# Patient Record
Sex: Female | Born: 1940 | Race: White | Hispanic: No | Marital: Married | Smoking: Former smoker
Health system: Southern US, Community
[De-identification: ages and names within clinical notes are randomized; demographics above are authoritative.]

## PROBLEM LIST (undated history)

## (undated) DIAGNOSIS — I6529 Occlusion and stenosis of unspecified carotid artery: Secondary | ICD-10-CM

## (undated) DIAGNOSIS — L309 Dermatitis, unspecified: Secondary | ICD-10-CM

## (undated) DIAGNOSIS — I10 Essential (primary) hypertension: Secondary | ICD-10-CM

## (undated) DIAGNOSIS — K529 Noninfective gastroenteritis and colitis, unspecified: Secondary | ICD-10-CM

## (undated) DIAGNOSIS — E785 Hyperlipidemia, unspecified: Secondary | ICD-10-CM

## (undated) DIAGNOSIS — K5090 Crohn's disease, unspecified, without complications: Secondary | ICD-10-CM

## (undated) DIAGNOSIS — K449 Diaphragmatic hernia without obstruction or gangrene: Secondary | ICD-10-CM

## (undated) DIAGNOSIS — R233 Spontaneous ecchymoses: Secondary | ICD-10-CM

## (undated) DIAGNOSIS — R42 Dizziness and giddiness: Secondary | ICD-10-CM

## (undated) DIAGNOSIS — I7300 Raynaud's syndrome without gangrene: Secondary | ICD-10-CM

## (undated) DIAGNOSIS — I059 Rheumatic mitral valve disease, unspecified: Secondary | ICD-10-CM

## (undated) DIAGNOSIS — S4291XA Fracture of right shoulder girdle, part unspecified, initial encounter for closed fracture: Secondary | ICD-10-CM

## (undated) DIAGNOSIS — E871 Hypo-osmolality and hyponatremia: Secondary | ICD-10-CM

## (undated) DIAGNOSIS — I4891 Unspecified atrial fibrillation: Secondary | ICD-10-CM

## (undated) DIAGNOSIS — K5792 Diverticulitis of intestine, part unspecified, without perforation or abscess without bleeding: Secondary | ICD-10-CM

## (undated) DIAGNOSIS — M329 Systemic lupus erythematosus, unspecified: Secondary | ICD-10-CM

## (undated) DIAGNOSIS — K219 Gastro-esophageal reflux disease without esophagitis: Secondary | ICD-10-CM

## (undated) DIAGNOSIS — D649 Anemia, unspecified: Secondary | ICD-10-CM

## (undated) DIAGNOSIS — M797 Fibromyalgia: Secondary | ICD-10-CM

## (undated) DIAGNOSIS — E889 Metabolic disorder, unspecified: Secondary | ICD-10-CM

## (undated) DIAGNOSIS — I272 Pulmonary hypertension, unspecified: Secondary | ICD-10-CM

## (undated) DIAGNOSIS — M8580 Other specified disorders of bone density and structure, unspecified site: Secondary | ICD-10-CM

## (undated) HISTORY — DX: Raynaud's syndrome without gangrene: I73.00

## (undated) HISTORY — DX: Rheumatic mitral valve disease, unspecified: I05.9

## (undated) HISTORY — DX: Essential (primary) hypertension: I10

## (undated) HISTORY — DX: Occlusion and stenosis of unspecified carotid artery: I65.29

## (undated) HISTORY — PX: ABDOMINAL HYSTERECTOMY: SHX81

## (undated) HISTORY — DX: Dizziness and giddiness: R42

## (undated) HISTORY — DX: Systemic lupus erythematosus, unspecified: M32.9

## (undated) HISTORY — DX: Fracture of right shoulder girdle, part unspecified, initial encounter for closed fracture: S42.91XA

## (undated) HISTORY — PX: CHOLECYSTECTOMY: SHX55

## (undated) HISTORY — DX: Hyperlipidemia, unspecified: E78.5

## (undated) HISTORY — DX: Spontaneous ecchymoses: R23.3

## (undated) HISTORY — DX: Anemia, unspecified: D64.9

## (undated) HISTORY — DX: Other specified disorders of bone density and structure, unspecified site: M85.80

## (undated) HISTORY — DX: Gastro-esophageal reflux disease without esophagitis: K21.9

## (undated) HISTORY — DX: Hypo-osmolality and hyponatremia: E87.1

## (undated) HISTORY — PX: EYE SURGERY: SHX253

## (undated) HISTORY — PX: APPENDECTOMY: SHX54

## (undated) HISTORY — DX: Diverticulitis of intestine, part unspecified, without perforation or abscess without bleeding: K57.92

## (undated) HISTORY — PX: PARTIAL COLECTOMY: SHX5273

## (undated) HISTORY — DX: Fibromyalgia: M79.7

## (undated) HISTORY — DX: Unspecified atrial fibrillation: I48.91

## (undated) HISTORY — DX: Dermatitis, unspecified: L30.9

## (undated) HISTORY — DX: Noninfective gastroenteritis and colitis, unspecified: K52.9

## (undated) HISTORY — DX: Diaphragmatic hernia without obstruction or gangrene: K44.9

## (undated) HISTORY — DX: Metabolic disorder, unspecified: E88.9

## (undated) HISTORY — DX: Pulmonary hypertension, unspecified: I27.20

---

## 2012-04-25 ENCOUNTER — Encounter: Payer: Self-pay | Admitting: Vascular Surgery

## 2012-04-28 ENCOUNTER — Encounter: Payer: Self-pay | Admitting: Vascular Surgery

## 2012-04-29 ENCOUNTER — Ambulatory Visit (INDEPENDENT_AMBULATORY_CARE_PROVIDER_SITE_OTHER): Payer: Medicare Other | Admitting: Vascular Surgery

## 2012-04-29 ENCOUNTER — Encounter: Payer: Self-pay | Admitting: Vascular Surgery

## 2012-04-29 ENCOUNTER — Other Ambulatory Visit: Payer: Self-pay

## 2012-04-29 VITALS — BP 146/82 | HR 60 | Resp 18 | Ht 64.0 in | Wt 118.0 lb

## 2012-04-29 DIAGNOSIS — I6529 Occlusion and stenosis of unspecified carotid artery: Secondary | ICD-10-CM

## 2012-04-29 NOTE — Progress Notes (Signed)
[facility]ist [facility]   Patient name: Alyssa Caldwell MRN: [id number] DOB: Aug 31, 1940 Sex: female   Referred by: Elenor Legato  Reason for referral: No chief complaint on file.   HISTORY OF PRESENT ILLNESS: The patient is seen today for discussion regarding carotid duplex revealing moderate to severe left carotid stenosis. I had operated on her father with a carotid endarterectomy which was uneventful at the age of 17. This isEllen Caldwell's mother. She denies any episodes of 8 aphasia or right sided weakness. She does have multiple ocular diagnoses with macular degeneration and others. She does report some occasional vision loss in her right eye and has seen multiple I. specialist regarding this. There has been no suggestion of Hollenhorst plaque.  Past Medical History  Diagnosis Date  . Anemia   . Dermatitis   . Vertigo   . Spontaneous ecchymoses   . Carotid artery occlusion   . Hyperlipidemia   . Diverticulitis   . Fibromyalgia   . Enteritis   . Pulmonary hypertension   . Osteopenia   . Raynaud's disease   . GERD (gastroesophageal reflux disease)   . Hypertension   . Mitral valve disorders   . Enzymopathy   . Systemic lupus erythematosus     Past Surgical History  Procedure Date  . Abdominal hysterectomy   . Appendectomy   . Cholecystectomy   . Partial colectomy     History   Social History  . Marital Status: Married    Spouse Name: N/A    Number of Children: N/A  . Years of Education: N/A   Occupational History  . Not on file.   Social History Main Topics  . Smoking status: Former Smoker    Types: Cigarettes    Quit date: 04/25/2002  . Smokeless tobacco: Not on file  . Alcohol Use: Yes  . Drug Use: No  . Sexually Active: Not on file   Other Topics Concern  . Not on file   Social History Narrative  . No narrative on file    Family History  Problem Relation Age of Onset  . Stroke Father   . Heart disease Father     Allergies as  of 04/29/2012 - Review Complete 04/29/2012  Allergen Reaction Noted  . Codeine  04/25/2012  . Lyrica (pregabalin)  04/25/2012  . Tape  04/29/2012    Current Outpatient Prescriptions on File Prior to Visit  Medication Sig Dispense Refill  . calcium carbonate (OS-CAL) 600 MG TABS Take 600 mg by mouth 2 (two) times daily with a meal.      . Cholecalciferol (VITAMIN D3) 5000 UNITS CAPS Take 5,000 Units by mouth daily.      . cyclobenzaprine (FLEXERIL) 10 MG tablet Take 10 mg by mouth 3 (three) times daily as needed.      . fluticasone (FLOVENT DISKUS) 50 MCG/BLIST diskus inhaler Inhale 1 puff into the lungs 2 (two) times daily.      . hydroxychloroquine (PLAQUENIL) 200 MG tablet Take 200 mg by mouth 2 (two) times daily.      Marland Kitchen ibandronate (BONIVA) 150 MG tablet Take 150 mg by mouth every 30 (thirty) days. Take in the morning with a full glass of water, on an empty stomach, and do not take anything else by mouth or lie down for the next 30 min.      . inFLIXimab (REMICADE) 100 MG injection Inject 100 mg into the vein. Every six weeks 100mg  IV      .  metoprolol succinate (TOPROL-XL) 25 MG 24 hr tablet Take 25 mg by mouth daily.      Marland Kitchen omeprazole (PRILOSEC) 40 MG capsule Take 40 mg by mouth daily.      . Probiotic Product (ALIGN PO) Take by mouth daily.      Marland Kitchen torsemide (DEMADEX) 10 MG tablet Take 10 mg by mouth daily.      . zaleplon (SONATA) 5 MG capsule Take 5 mg by mouth at bedtime.         REVIEW OF SYSTEMS:  Positives indicated with an "X"  CARDIOVASCULAR:  [ ]  chest pain   [x ] chest pressure   [ ]  palpitations   [ ]  orthopnea   [x ] dyspnea on exertion   [ ]  claudication   [ ]  rest pain   [ ]  DVT   [ ]  phlebitis PULMONARY:   [ ]  productive cough   [ ]  asthma   [ ]  wheezing NEUROLOGIC:   [ x] weakness  [x ] paresthesias  [ ]  aphasia  [ ]  amaurosis  [x ] dizziness HEMATOLOGIC:   [x ] bleeding problems   [ ]  clotting disorders MUSCULOSKELETAL:  [ ]  joint pain   [ ]  joint  swelling GASTROINTESTINAL: [ ]   blood in stool  [ ]   hematemesis GENITOURINARY:  [ ]   dysuria  [ ]   hematuria PSYCHIATRIC:  [ ]  history of major depression INTEGUMENTARY:  [x ] rashes  [x ] ulcers CONSTITUTIONAL:  [x ] fever   [x ] chills  PHYSICAL EXAMINATION:  General: The patient is a well-nourished female, in no acute distress. Vital signs are BP 146/82  Pulse 60  Resp 18  Ht 5\' 4"  (1.626 m)  Wt 118 lb (53.524 kg)  BMI 20.25 kg/m2 Pulmonary: There is a good air exchange bilaterally without wheezing or rales. Abdomen: Soft and non-tender with normal pitch bowel sounds. Musculoskeletal: There are no major deformities.  There is no significant extremity pain. Neurologic: No focal weakness or paresthesias are detected, Skin: There are no ulcer or rashes noted. Psychiatric: The patient has normal affect. Cardiovascular: There is a regular rate and rhythm without significant murmur appreciated. Carotid arteries without bruits bilaterally Pulse status 2+ radial 2+ femoral 2+ popliteal and 1-2+ posterior tibial pulses bilaterally   Vascular Lab Studies:  Carotid duplex from [facility] reviewed. She is in the lower scale of the 60-79% stenosis on the left. No significant stenosis on the right  Impression and Plan:  Asymptomatic left moderate to severe internal carotid artery stenosis. A long discussion with the patient and her husband regarding treatment of this. I would recommend that we followup her duplex 6 months from her last study which was in July. I discussed the symptoms of a left brain event and left eye visual changes. He'll notify us should this occur otherwise we will see her in 4 months with 6-2+ duplex followup of her moderate to severe left carotid stenosis    Alyssa Caldwell [facility] [place] Office: [phone]

## 2012-04-29 NOTE — Addendum Note (Signed)
Addended by: Sharee Pimple on: 04/29/2012 01:11 PM   Modules accepted: Orders

## 2012-05-16 ENCOUNTER — Inpatient Hospital Stay (HOSPITAL_COMMUNITY)
Admission: EM | Admit: 2012-05-16 | Discharge: 2012-05-20 | DRG: 312 | Disposition: A | Discharge status: Home / Self Care | Payer: Medicare Other | Attending: Internal Medicine | Admitting: Internal Medicine

## 2012-05-16 ENCOUNTER — Emergency Department (HOSPITAL_COMMUNITY): Payer: Medicare Other

## 2012-05-16 ENCOUNTER — Encounter (HOSPITAL_COMMUNITY): Payer: Self-pay | Admitting: Emergency Medicine

## 2012-05-16 DIAGNOSIS — E860 Dehydration: Secondary | ICD-10-CM

## 2012-05-16 DIAGNOSIS — E876 Hypokalemia: Secondary | ICD-10-CM

## 2012-05-16 DIAGNOSIS — R55 Syncope and collapse: Secondary | ICD-10-CM

## 2012-05-16 DIAGNOSIS — E871 Hypo-osmolality and hyponatremia: Secondary | ICD-10-CM

## 2012-05-16 DIAGNOSIS — D649 Anemia, unspecified: Secondary | ICD-10-CM | POA: Diagnosis present

## 2012-05-16 DIAGNOSIS — M329 Systemic lupus erythematosus, unspecified: Secondary | ICD-10-CM | POA: Diagnosis present

## 2012-05-16 DIAGNOSIS — Z9089 Acquired absence of other organs: Secondary | ICD-10-CM

## 2012-05-16 DIAGNOSIS — K219 Gastro-esophageal reflux disease without esophagitis: Secondary | ICD-10-CM | POA: Diagnosis present

## 2012-05-16 DIAGNOSIS — Z8719 Personal history of other diseases of the digestive system: Secondary | ICD-10-CM

## 2012-05-16 DIAGNOSIS — R110 Nausea: Secondary | ICD-10-CM | POA: Diagnosis present

## 2012-05-16 DIAGNOSIS — K5090 Crohn's disease, unspecified, without complications: Secondary | ICD-10-CM | POA: Diagnosis present

## 2012-05-16 DIAGNOSIS — E785 Hyperlipidemia, unspecified: Secondary | ICD-10-CM | POA: Diagnosis present

## 2012-05-16 DIAGNOSIS — I2789 Other specified pulmonary heart diseases: Secondary | ICD-10-CM | POA: Diagnosis present

## 2012-05-16 DIAGNOSIS — I951 Orthostatic hypotension: Principal | ICD-10-CM | POA: Diagnosis present

## 2012-05-16 DIAGNOSIS — Z885 Allergy status to narcotic agent status: Secondary | ICD-10-CM

## 2012-05-16 DIAGNOSIS — Z87891 Personal history of nicotine dependence: Secondary | ICD-10-CM

## 2012-05-16 DIAGNOSIS — IMO0002 Reserved for concepts with insufficient information to code with codable children: Secondary | ICD-10-CM

## 2012-05-16 DIAGNOSIS — R112 Nausea with vomiting, unspecified: Secondary | ICD-10-CM | POA: Diagnosis present

## 2012-05-16 DIAGNOSIS — Z90710 Acquired absence of both cervix and uterus: Secondary | ICD-10-CM

## 2012-05-16 DIAGNOSIS — Z888 Allergy status to other drugs, medicaments and biological substances status: Secondary | ICD-10-CM

## 2012-05-16 DIAGNOSIS — I6529 Occlusion and stenosis of unspecified carotid artery: Secondary | ICD-10-CM | POA: Diagnosis present

## 2012-05-16 DIAGNOSIS — E43 Unspecified severe protein-calorie malnutrition: Secondary | ICD-10-CM | POA: Diagnosis present

## 2012-05-16 DIAGNOSIS — I10 Essential (primary) hypertension: Secondary | ICD-10-CM | POA: Diagnosis present

## 2012-05-16 DIAGNOSIS — I059 Rheumatic mitral valve disease, unspecified: Secondary | ICD-10-CM | POA: Diagnosis present

## 2012-05-16 DIAGNOSIS — IMO0001 Reserved for inherently not codable concepts without codable children: Secondary | ICD-10-CM | POA: Diagnosis present

## 2012-05-16 DIAGNOSIS — Z681 Body mass index (BMI) 19 or less, adult: Secondary | ICD-10-CM

## 2012-05-16 DIAGNOSIS — M899 Disorder of bone, unspecified: Secondary | ICD-10-CM | POA: Diagnosis present

## 2012-05-16 DIAGNOSIS — I7300 Raynaud's syndrome without gangrene: Secondary | ICD-10-CM | POA: Diagnosis present

## 2012-05-16 DIAGNOSIS — Z79899 Other long term (current) drug therapy: Secondary | ICD-10-CM

## 2012-05-16 HISTORY — DX: Crohn's disease, unspecified, without complications: K50.90

## 2012-05-16 LAB — CBC WITH DIFFERENTIAL/PLATELET
Basophils Absolute: 0 10*3/uL (ref 0.0–0.1)
Eosinophils Relative: 0 % (ref 0–5)
Lymphocytes Relative: 11 % — ABNORMAL LOW (ref 12–46)
Lymphs Abs: 1 10*3/uL (ref 0.7–4.0)
MCV: 84.9 fL (ref 78.0–100.0)
Neutro Abs: 8.1 10*3/uL — ABNORMAL HIGH (ref 1.7–7.7)
Neutrophils Relative %: 85 % — ABNORMAL HIGH (ref 43–77)
Platelets: 266 10*3/uL (ref 150–400)
RBC: 3.85 MIL/uL — ABNORMAL LOW (ref 3.87–5.11)
RDW: 12.4 % (ref 11.5–15.5)
WBC: 9.6 10*3/uL (ref 4.0–10.5)

## 2012-05-16 LAB — URINALYSIS, ROUTINE W REFLEX MICROSCOPIC
Ketones, ur: NEGATIVE mg/dL
Protein, ur: NEGATIVE mg/dL
Urobilinogen, UA: 1 mg/dL (ref 0.0–1.0)

## 2012-05-16 LAB — URINE MICROSCOPIC-ADD ON

## 2012-05-16 LAB — BASIC METABOLIC PANEL
Calcium: 8.5 mg/dL (ref 8.4–10.5)
Creatinine, Ser: 0.77 mg/dL (ref 0.50–1.10)
GFR calc non Af Amer: 82 mL/min — ABNORMAL LOW (ref >90)
Sodium: 118 mEq/L — CL (ref 135–145)

## 2012-05-16 LAB — TROPONIN I: Troponin I: <0.3 ng/mL (ref <0.30)

## 2012-05-16 MED ORDER — PREDNISONE 10 MG PO TABS
10.0000 mg | ORAL_TABLET | Freq: Four times a day (QID) | ORAL | Status: DC
  Administered 2012-05-16 – 2012-05-20 (×16): 10 mg via ORAL
  Filled 2012-05-16 (×18): qty 1

## 2012-05-16 MED ORDER — SODIUM CHLORIDE 0.9 % IJ SOLN
3.0000 mL | Freq: Two times a day (BID) | INTRAMUSCULAR | Status: DC
  Administered 2012-05-18 – 2012-05-19 (×3): 3 mL via INTRAVENOUS

## 2012-05-16 MED ORDER — METOPROLOL SUCCINATE ER 25 MG PO TB24
25.0000 mg | ORAL_TABLET | Freq: Every day | ORAL | Status: DC
  Administered 2012-05-16 – 2012-05-20 (×5): 25 mg via ORAL
  Filled 2012-05-16 (×5): qty 1

## 2012-05-16 MED ORDER — FLUTICASONE PROPIONATE 50 MCG/ACT NA SUSP
2.0000 | Freq: Every day | NASAL | Status: DC | PRN
  Filled 2012-05-16: qty 16

## 2012-05-16 MED ORDER — SODIUM CHLORIDE 0.9 % IV BOLUS (SEPSIS)
1000.0000 mL | Freq: Once | INTRAVENOUS | Status: AC
  Administered 2012-05-16: 1000 mL via INTRAVENOUS

## 2012-05-16 MED ORDER — ONDANSETRON HCL 4 MG PO TABS
4.0000 mg | ORAL_TABLET | Freq: Four times a day (QID) | ORAL | Status: DC | PRN
  Filled 2012-05-16: qty 1

## 2012-05-16 MED ORDER — ALBUTEROL SULFATE (5 MG/ML) 0.5% IN NEBU: 2.5000 mg | INHALATION_SOLUTION | RESPIRATORY_TRACT | Status: DC | PRN

## 2012-05-16 MED ORDER — CYCLOBENZAPRINE HCL 10 MG PO TABS: 10.0000 mg | ORAL_TABLET | Freq: Three times a day (TID) | ORAL | Status: DC | PRN

## 2012-05-16 MED ORDER — SODIUM CHLORIDE 0.9 % IV SOLN
Freq: Once | INTRAVENOUS | Status: AC
  Administered 2012-05-16: 14:00:00 via INTRAVENOUS

## 2012-05-16 MED ORDER — ALIGN PO CAPS
1.0000 | ORAL_CAPSULE | Freq: Every day | ORAL | Status: DC
  Filled 2012-05-16: qty 1

## 2012-05-16 MED ORDER — ENOXAPARIN SODIUM 40 MG/0.4ML ~~LOC~~ SOLN
40.0000 mg | SUBCUTANEOUS | Status: DC
  Administered 2012-05-16 – 2012-05-19 (×4): 40 mg via SUBCUTANEOUS
  Filled 2012-05-16 (×5): qty 0.4

## 2012-05-16 MED ORDER — PANTOPRAZOLE SODIUM 40 MG PO TBEC
80.0000 mg | DELAYED_RELEASE_TABLET | Freq: Every day | ORAL | Status: DC
  Administered 2012-05-16 – 2012-05-20 (×5): 80 mg via ORAL
  Filled 2012-05-16 (×4): qty 2
  Filled 2012-05-16: qty 1

## 2012-05-16 MED ORDER — ATORVASTATIN CALCIUM 20 MG PO TABS
20.0000 mg | ORAL_TABLET | Freq: Every day | ORAL | Status: DC
  Administered 2012-05-19 – 2012-05-20 (×2): 20 mg via ORAL
  Filled 2012-05-16 (×5): qty 1

## 2012-05-16 MED ORDER — ONDANSETRON HCL 4 MG/2ML IJ SOLN
4.0000 mg | Freq: Four times a day (QID) | INTRAMUSCULAR | Status: DC | PRN
  Administered 2012-05-16 – 2012-05-20 (×11): 4 mg via INTRAVENOUS
  Filled 2012-05-16 (×11): qty 2

## 2012-05-16 MED ORDER — ACETAMINOPHEN 650 MG RE SUPP: 650.0000 mg | Freq: Four times a day (QID) | RECTAL | Status: DC | PRN

## 2012-05-16 MED ORDER — ZOLPIDEM TARTRATE 5 MG PO TABS
5.0000 mg | ORAL_TABLET | Freq: Every evening | ORAL | Status: DC | PRN
  Administered 2012-05-16 – 2012-05-19 (×4): 5 mg via ORAL
  Filled 2012-05-16 (×4): qty 1

## 2012-05-16 MED ORDER — POTASSIUM CHLORIDE CRYS ER 20 MEQ PO TBCR
60.0000 meq | EXTENDED_RELEASE_TABLET | Freq: Once | ORAL | Status: AC
  Administered 2012-05-16: 60 meq via ORAL
  Filled 2012-05-16: qty 3

## 2012-05-16 MED ORDER — ACETAMINOPHEN 325 MG PO TABS
650.0000 mg | ORAL_TABLET | Freq: Four times a day (QID) | ORAL | Status: DC | PRN
  Administered 2012-05-16: 650 mg via ORAL
  Filled 2012-05-16: qty 2

## 2012-05-16 MED ORDER — RISAQUAD PO CAPS
1.0000 | ORAL_CAPSULE | Freq: Every day | ORAL | Status: DC
  Administered 2012-05-19 – 2012-05-20 (×2): 1 via ORAL
  Filled 2012-05-16 (×5): qty 1

## 2012-05-16 MED ORDER — POTASSIUM CHLORIDE IN NACL 20-0.9 MEQ/L-% IV SOLN
INTRAVENOUS | Status: DC
  Administered 2012-05-16: 21:00:00 via INTRAVENOUS
  Filled 2012-05-16 (×4): qty 1000

## 2012-05-16 NOTE — ED Notes (Signed)
Patient states she is still unable to void at this time

## 2012-05-16 NOTE — ED Notes (Signed)
Patient is unable to void at this time 

## 2012-05-16 NOTE — ED Notes (Signed)
Lab called-last draws were clotted.

## 2012-05-16 NOTE — H&P (Signed)
[facility] History and Physical  Alyssa Caldwell WUJ:[id number] DOB: 02/25/1941 DOA: 05/16/2012  Referring physician: Derwood Kaplan PCP: No primary provider on file.  Specialists:  Chief Complaint: Passed out  HPI: Alyssa Caldwell is a 71 y.o. female with history of lupus, Crohn's disease, hypertension, hyperlipidemia, chronic hyponatremia was brought to the emergency department secondary to passing out. She has been unwell for approximately a week. She gives 6 days history of nausea which is worse when she is ambulating or sitting up, poor oral intake, intermittent nausea and vomiting and diarrhea. No coffee grounds, blood or melena. Denies abdominal pain. Complains of feeling hot and cold. Feels dizzy and lightheaded at times on sitting or ambulating. Today she and her husband drove from [place] to [place]. When she got out of the car and was talking to her friend, she felt lightheaded and then according to her family she had a blank stare for half a minute and then slumped is and they had to hold her. She lost consciousness for approximately 2 minutes. There was no seizure-like activity or urinary incontinence. They laid her down on the driveway. She regained consciousness. She denies any preceding her subsequent chest pain or palpitations. She feels generally tired all the time. Many of her GI symptoms she says are intermittent and chronic. She was last seen by her primary care physician 2 days ago when he found her sodium to be 121 and discontinued her diuretics and asked her to return in a few days for repeat BMP. She indicates that her sodiums are usually in the low 120s. Evaluation in the ED shows low sodium of 119, potassium of 3 and CT head negative for acute findings. The hospitalist service is requested to admit for further evaluation and management.   Review of Systems: All systems reviewed and apart from history of presenting illness is pertinent for easy bruising. Otherwise  negative.  Past Medical History  Diagnosis Date  . Anemia   . Dermatitis   . Vertigo   . Spontaneous ecchymoses   . Carotid artery occlusion   . Hyperlipidemia   . Diverticulitis   . Fibromyalgia   . Enteritis   . Pulmonary hypertension   . Osteopenia   . Raynaud's disease   . GERD (gastroesophageal reflux disease)   . Hypertension   . Mitral valve disorders   . Enzymopathy   . Systemic lupus erythematosus   . Hyperlipidemia   . Crohn's disease   . Raynaud's disease    Past Surgical History  Procedure Date  . Abdominal hysterectomy   . Appendectomy   . Cholecystectomy   . Partial colectomy   . Eye surgery    Social History:  reports that she quit smoking about 10 years ago. Her smoking use included Cigarettes. She does not have any smokeless tobacco history on file. She reports that she drinks alcohol. She reports that she does not use illicit drugs. Independent of activities of daily living. Married.  Allergies  Allergen Reactions  . Codeine   . Lyrica (Pregabalin)   . Plaquenil (Hydroxychloroquine) Other (See Comments)    Vision problems  . Tape     Very sensitive to most tapes    Family History  Problem Relation Age of Onset  . Stroke Father   . Heart disease Father     Prior to Admission medications   Medication Sig Start Date End Date Taking? Authorizing Provider  atorvastatin (LIPITOR) 20 MG tablet Take 20 mg by mouth daily.   Yes  Historical Provider, MD  beta carotene w/minerals (OCUVITE) tablet Take 1 tablet by mouth daily.   Yes Historical Provider, MD  calcium carbonate (TUMS - DOSED IN MG ELEMENTAL CALCIUM) 500 MG chewable tablet Chew 3,000 mg by mouth daily.   Yes Historical Provider, MD  chlorthalidone (HYGROTON) 25 MG tablet Take 25 mg by mouth daily.   Yes Historical Provider, MD  Cholecalciferol (VITAMIN D3) 5000 UNITS CAPS Take 2,500 Units by mouth daily.    Yes Historical Provider, MD  cyclobenzaprine (FLEXERIL) 10 MG tablet Take 10 mg by  mouth 3 (three) times daily as needed. For muscle spasms   Yes Historical Provider, MD  fluticasone (FLONASE) 50 MCG/ACT nasal spray Place 2 sprays into the nose daily as needed. For allergies   Yes Historical Provider, MD  ibandronate (BONIVA) 150 MG tablet Take 150 mg by mouth every 30 (thirty) days. Take in the morning with a full glass of water, on an empty stomach, and do not take anything else by mouth or lie down for the next 30 min.   Yes Historical Provider, MD  inFLIXimab (REMICADE) 100 MG injection Inject 100 mg into the vein. Every six weeks 100mg  IV   Yes Historical Provider, MD  metoprolol succinate (TOPROL-XL) 25 MG 24 hr tablet Take 25 mg by mouth daily.   Yes Historical Provider, MD  omeprazole (PRILOSEC) 40 MG capsule Take 40 mg by mouth daily.   Yes Historical Provider, MD  ondansetron (ZOFRAN) 4 MG tablet Take 4 mg by mouth 4 (four) times daily as needed. For nausea   Yes Historical Provider, MD  predniSONE (DELTASONE) 10 MG tablet Take 10 mg by mouth 4 (four) times daily.    Yes Historical Provider, MD  Probiotic Product (ALIGN PO) Take 1 capsule by mouth daily.    Yes Historical Provider, MD  zolpidem (AMBIEN) 5 MG tablet Take 5 mg by mouth at bedtime as needed. For sleep   Yes Historical Provider, MD   Physical Exam: Filed Vitals:   05/16/12 1322 05/16/12 1323 05/16/12 1324 05/16/12 1454  BP: 156/76 162/73 160/78 150/79  Pulse: 79 80 79 89  Temp:      TempSrc:      Resp:    14  SpO2:    100%     General exam: Small built and thinly nourished female patient was lying comfortably on the gurney and is in no obvious distress.  Head, eyes and ENT: Nontraumatic and normocephalic pupils equally reacting to light and accommodation. Oral mucosa is dry but without acute findings.  Neck: Supple. No JVD or carotid bruit or thyromegaly.  Lymphatics: No lymphadenopathy.  Respiratory system: Clear to auscultation. No increased work of breathing.  Cardiovascular: S1 and S2  heard, regular rate and rhythm. No murmurs, JVD or pedal edema.  Gastrointestinal system: Abdomen is nondistended, soft and nontender organomegaly masses appreciated normal bowel sounds heard.  Central system: Alert and oriented. No focal neurological deficits.  Extremities: Symmetrical 5 x 5 power appears peripheral pulses symmetrically felt.  Skin: Multiple areas of bruising. Patient has small open ulcers on the right shin and left arm which are sites of skin biopsies and are fairly clean.  Musculoskeletal system: Negative exam.  Psychiatric: Pleasant and cooperative.  Labs on Admission:  Basic Metabolic Panel:  Lab 05/16/12 4098  NA 118*  K 3.0*  CL 81*  CO2 24  GLUCOSE 108*  BUN 10  CREATININE 0.77  CALCIUM 8.5  MG 1.6  PHOS 1.4*   Liver  Function Tests: No results found for this basename: AST:5,ALT:5,ALKPHOS:5,BILITOT:5,PROT:5,ALBUMIN:5 in the last 168 hours No results found for this basename: LIPASE:5,AMYLASE:5 in the last 168 hours No results found for this basename: AMMONIA:5 in the last 168 hours CBC:  Lab 05/16/12 1620  WBC 9.6  NEUTROABS 8.1*  HGB 12.3  HCT 32.7*  MCV 84.9  PLT 266   Cardiac Enzymes:  Lab 05/16/12 1326  CKTOTAL --  CKMB --  CKMBINDEX --  TROPONINI <0.30    BNP (last 3 results) No results found for this basename: PROBNP:3 in the last 8760 hours CBG: No results found for this basename: GLUCAP:5 in the last 168 hours  Radiological Exams on Admission: Ct Head Wo Contrast  05/16/2012  *RADIOLOGY REPORT*  Clinical Data: Code stroke, slurred speech, blurred vision, syncope  CT HEAD WITHOUT CONTRAST  Technique:  Contiguous axial images were obtained from the base of the skull through the vertex without contrast.  Comparison: None.  Findings: Diffuse mild cortical volume loss noted with proportional ventricular prominence. No acute hemorrhage, acute infarction, or mass lesion is identified.  No midline shift.  No ventriculomegaly. No skull  fracture.  No midline shift.  Orbits and paranasal sinuses are intact.  IMPRESSION: No acute intracranial finding. These results were called by telephone on 05/16/2012 at 12:10 p.m. to Dr. Roseanne Reno, who verbally acknowledged these results.   Original Report Authenticated By: Harrel Lemon, M.D.     EKG: Independently reviewed. Normal sinus rhythm without acute changes.  Assessment/Plan Principal Problem:  *Syncope and collapse Active Problems:  Hypokalemia  Hyponatremia  Dehydration   1. Syncope: Likely secondary to hypotension/orthostatic hypotension due to dehydration. Admit for observation and evaluation. Admit to telemetry. CT head negative for acute findings. Will check carotid Dopplers and 2-D echocardiogram. We'll get PT evaluation. 2. Hyponatremia: Probably chronic with some acute component. Secondary to dehydration, thiazide diuretics-discontinued recently,? SIADH. Continue normal saline and came to correct by no more than 10 mEq in 24 hours. Recheck BMP in a few hours. 3. Dehydration: Treat with IV fluids and monitor. 4. Hypokalemia: By mouth repleted. Will follow BMP in a few hours. 5. History of lupus: Continue prednisone. Lateral was recently discontinued secondary to high complications. Methotrexate was also recently discontinued. Sees rheumatologist as outpatient. 6. History of Crohn's disease: With chronic intermittent GI symptoms. Gets Remicade as outpatient. 7. Hypertension: Currently controlled. Continue Toprol.  Code Status:  Family Communication: Discussed with patient spouse and daughter at the bedside. Disposition Plan: Home? 10/19  Time spent: 45 minutes  [facility] Pager [phone]  If 7PM-7AM, please contact night-coverage [email or web address] Password [facility] 05/16/2012, 6:31 PM

## 2012-05-16 NOTE — ED Provider Notes (Signed)
History     CSN: [id number]  Arrival date & time 05/16/12  1146   First MD Initiated Contact with Patient 05/16/12 1232      Chief Complaint  Patient presents with  . Code Stroke    (Consider location/radiation/quality/duration/timing/severity/associated sxs/prior treatment) HPI Comments: Pt comes in with cc of syncope. Pt doesn't recall the event. PMHx of SLE, on plaquenil in the past ,and recent trial of methotrexate, which was aborted on Friday, crohns disease, HTN and carotid stenosis that is being monitored. Per husband, who is at bedside, patient had an episode this afternoon when she passed out. She had some incomprehensible speech prior to this happening and patient cant recall this event at all. No hx of syncope in the past. There was no seizure like activity, and no incontinence or post ictal phase. Pt had no chest pain, palpitations prior to the sx, or after. She had no headaches today. Denies any n/v/f/c.    The history is provided by the patient.    Past Medical History  Diagnosis Date  . Anemia   . Dermatitis   . Vertigo   . Spontaneous ecchymoses   . Carotid artery occlusion   . Hyperlipidemia   . Diverticulitis   . Fibromyalgia   . Enteritis   . Pulmonary hypertension   . Osteopenia   . Raynaud's disease   . GERD (gastroesophageal reflux disease)   . Hypertension   . Mitral valve disorders   . Enzymopathy   . Systemic lupus erythematosus     Past Surgical History  Procedure Date  . Abdominal hysterectomy   . Appendectomy   . Cholecystectomy   . Partial colectomy     Family History  Problem Relation Age of Onset  . Stroke Father   . Heart disease Father     History  Substance Use Topics  . Smoking status: Former Smoker    Types: Cigarettes    Quit date: 04/25/2002  . Smokeless tobacco: Not on file  . Alcohol Use: Yes    OB History    Grav Para Term Preterm Abortions TAB SAB Ect Mult Living                  Review of Systems    Constitutional: Positive for appetite change, fatigue and unexpected weight change. Negative for activity change.  HENT: Negative for facial swelling and neck pain.   Respiratory: Negative for cough, shortness of breath and wheezing.   Cardiovascular: Negative for chest pain.  Gastrointestinal: Negative for nausea, vomiting, abdominal pain, diarrhea, constipation, blood in stool and abdominal distention.  Genitourinary: Negative for hematuria and difficulty urinating.  Skin: Negative for color change.  Neurological: Positive for syncope, speech difficulty and weakness.  Hematological: Does not bruise/bleed easily.  Psychiatric/Behavioral: Negative for confusion.    Allergies  Codeine; Lyrica; Plaquenil; and Tape  Home Medications   Current Outpatient Rx  Name Route Sig Dispense Refill  . OCUVITE PO TABS Oral Take 1 tablet by mouth daily.    Marland Kitchen CALCIUM CARBONATE 600 MG PO TABS Oral Take 600 mg by mouth 2 (two) times daily with a meal.    . VITAMIN D3 5000 UNITS PO CAPS Oral Take 5,000 Units by mouth daily.    . CYCLOBENZAPRINE HCL 10 MG PO TABS Oral Take 10 mg by mouth 3 (three) times daily as needed. For muscle spasms    . IBANDRONATE SODIUM 150 MG PO TABS Oral Take 150 mg by mouth every 30 (thirty) days.  Take in the morning with a full glass of water, on an empty stomach, and do not take anything else by mouth or lie down for the next 30 min.    . INFLIXIMAB 100 MG IV SOLR Intravenous Inject 100 mg into the vein. Every six weeks 100mg  IV    . METOPROLOL SUCCINATE ER 25 MG PO TB24 Oral Take 25 mg by mouth daily.    Marland Kitchen OMEPRAZOLE 40 MG PO CPDR Oral Take 40 mg by mouth daily.    Marland Kitchen ONDANSETRON HCL 4 MG PO TABS Oral Take 4 mg by mouth 4 (four) times daily as needed. For nausea    . PREDNISONE 10 MG PO TABS Oral Take 10 mg by mouth 4 (four) times daily.     Marland Kitchen ALIGN PO Oral Take 1 capsule by mouth daily.     Marland Kitchen ZOLPIDEM TARTRATE 5 MG PO TABS Oral Take 5 mg by mouth at bedtime as needed. For  sleep      BP 150/79  Pulse 89  Temp 97.7 F (36.5 C) (Oral)  Resp 14  SpO2 100%  Physical Exam  Nursing note and vitals reviewed. Constitutional: She is oriented to person, place, and time. She appears well-developed.  HENT:  Head: Normocephalic and atraumatic.  Eyes: Conjunctivae normal and EOM are normal. Pupils are equal, round, and reactive to light.  Neck: Normal range of motion. Neck supple. No JVD present.  Cardiovascular: Normal rate, regular rhythm and normal heart sounds.   No murmur heard. Pulmonary/Chest: Effort normal and breath sounds normal. No respiratory distress.  Abdominal: Soft. Bowel sounds are normal. She exhibits no distension. There is no tenderness. There is no rebound and no guarding.  Musculoskeletal: She exhibits no edema.  Neurological: She is alert and oriented to person, place, and time. No cranial nerve deficit. Coordination normal.  Skin: Skin is warm and dry.    ED Course  Procedures (including critical care time)  Labs Reviewed  BASIC METABOLIC PANEL - Abnormal; Notable for the following:    Sodium 118 (*)     Potassium 3.0 (*)     Chloride 81 (*)     Glucose, Bld 108 (*)     GFR calc non Af Amer 82 (*)     All other components within normal limits  URINALYSIS, ROUTINE W REFLEX MICROSCOPIC - Abnormal; Notable for the following:    Hgb urine dipstick TRACE (*)     Leukocytes, UA SMALL (*)     All other components within normal limits  PHOSPHORUS - Abnormal; Notable for the following:    Phosphorus 1.4 (*)     All other components within normal limits  TROPONIN I  MAGNESIUM  URINE MICROSCOPIC-ADD ON   Ct Head Wo Contrast  05/16/2012  *RADIOLOGY REPORT*  Clinical Data: Code stroke, slurred speech, blurred vision, syncope  CT HEAD WITHOUT CONTRAST  Technique:  Contiguous axial images were obtained from the base of the skull through the vertex without contrast.  Comparison: None.  Findings: Diffuse mild cortical volume loss noted with  proportional ventricular prominence. No acute hemorrhage, acute infarction, or mass lesion is identified.  No midline shift.  No ventriculomegaly. No skull fracture.  No midline shift.  Orbits and paranasal sinuses are intact.  IMPRESSION: No acute intracranial finding. These results were called by telephone on 05/16/2012 at 12:10 p.m. to Dr. Roseanne Reno, who verbally acknowledged these results.   Original Report Authenticated By: Harrel Lemon, M.D.      No diagnosis  found.    MDM  DDx includes: Orthostatic hypotension Stroke Vertebral artery dissection/stenosis Dysrhythmia PE Vasovagal/neurocardiogenic syncope Aortic stenosis Valvular disorder/Cardiomyopathy Anemia Dehydration Medication side effects  Pt comes in with cc of syncope. Pt has hx of SLE, not on any meds right now. She has no hx of CHF, no hx of dysrhythmias. Pt had an unprovoked syncopal episodes with some semi conscious state prior to it, and no prodrome before the event. Neuro exam is WNL, and no concerns for seizures at this time. i did call dr. Roseanne Reno about the dysarthria with LOC, and he agrees - that this doesn't appear to be a TIA case.  Pt reports poor appetite, weight loss over the past few weeks. May be paraneoplastic syndrome? Failure to thrive?   Date: 05/16/2012  Rate: 70  Rhythm: normal sinus rhythm  QRS Axis: normal  Intervals: normal  ST/T Wave abnormalities: nonspecific ST/T changes  Conduction Disutrbances:none  Narrative Interpretation:   Old EKG Reviewed: unchanged         Derwood Kaplan, MD 05/16/12 1542

## 2012-05-16 NOTE — ED Notes (Signed)
Pt sts difficulty with urination over last week.

## 2012-05-17 DIAGNOSIS — R55 Syncope and collapse: Secondary | ICD-10-CM

## 2012-05-17 LAB — COMPREHENSIVE METABOLIC PANEL
ALT: 24 U/L (ref 0–35)
AST: 28 U/L (ref 0–37)
Albumin: 3.6 g/dL (ref 3.5–5.2)
Alkaline Phosphatase: 53 U/L (ref 39–117)
Chloride: 92 mEq/L — ABNORMAL LOW (ref 96–112)
Potassium: 3.9 mEq/L (ref 3.5–5.1)
Sodium: 122 mEq/L — ABNORMAL LOW (ref 135–145)
Total Bilirubin: 0.3 mg/dL (ref 0.3–1.2)
Total Protein: 6.8 g/dL (ref 6.0–8.3)

## 2012-05-17 LAB — CBC
Hemoglobin: 10.9 g/dL — ABNORMAL LOW (ref 12.0–15.0)
MCHC: 36.9 g/dL — ABNORMAL HIGH (ref 30.0–36.0)
Platelets: 246 10*3/uL (ref 150–400)
RBC: 3.41 MIL/uL — ABNORMAL LOW (ref 3.87–5.11)

## 2012-05-17 LAB — BASIC METABOLIC PANEL
BUN: 6 mg/dL (ref 6–23)
Calcium: 8 mg/dL — ABNORMAL LOW (ref 8.4–10.5)
GFR calc Af Amer: >90 mL/min (ref >90)
GFR calc non Af Amer: 85 mL/min — ABNORMAL LOW (ref >90)
GFR calc non Af Amer: 87 mL/min — ABNORMAL LOW (ref >90)
Potassium: 4.1 mEq/L (ref 3.5–5.1)
Sodium: 126 mEq/L — ABNORMAL LOW (ref 135–145)
Sodium: 123 mEq/L — ABNORMAL LOW (ref 135–145)

## 2012-05-17 LAB — OSMOLALITY, URINE: Osmolality, Ur: 232 mOsm/kg — ABNORMAL LOW (ref 390–1090)

## 2012-05-17 MED ORDER — ENSURE COMPLETE PO LIQD
237.0000 mL | Freq: Every day | ORAL | Status: DC
  Administered 2012-05-17 – 2012-05-20 (×3): 237 mL via ORAL

## 2012-05-17 NOTE — Progress Notes (Signed)
*  PRELIMINARY RESULTS* Vascular Ultrasound Carotid Duplex (Doppler) has been completed.  Preliminary findings: Right= no significant ICA stenosis with antegrade vertebral flow. Left= 60-79% ICA stenosis, low end of scale, with antegrade vertebral flow.  Farrel Demark, RDMS, RVT  05/17/2012, 9:20 AM

## 2012-05-17 NOTE — Progress Notes (Signed)
[facility] PROGRESS NOTE  Alyssa Caldwell ZOX:[id number] DOB: 1940-09-15 DOA: 05/16/2012 PCP: No primary provider on file.   Interim history  Alyssa Caldwell is a 71 y.o. female with history of lupus, Crohn's disease, hypertension, hyperlipidemia, chronic hyponatremia was brought to the emergency department secondary to passing out. She has been unwell for approximately a week. She gives 6 days history of nausea which is worse when she is ambulating or sitting up, poor oral intake, intermittent nausea and vomiting and diarrhea. No coffee grounds, blood or melena. Denies abdominal pain. Complains of feeling hot and cold. Feels dizzy and lightheaded at times on sitting or ambulating. Today she and her husband drove from [place] to [place]. When she got out of the car and was talking to her friend, she felt lightheaded and then according to her family she had a blank stare for half a minute and then slumped is and they had to hold her. She lost consciousness for approximately 2 minutes. There was no seizure-like activity or urinary incontinence. They laid her down on the driveway. She regained consciousness. When EMS came to get her, her blood pressure was 90 mmHg per family. She denies any preceding her subsequent chest pain or palpitations. She feels generally tired all the time. Many of her GI symptoms she says are intermittent and chronic. She was last seen by her primary care physician 2 days ago when he found her sodium to be 121 and discontinued her diuretics and asked her to return in a few days for repeat BMP. She indicates that her sodiums are usually in the low 120s. Evaluation in the ED shows low sodium of 119, potassium of 3 and CT head negative for acute findings. The hospitalist service is requested to admit for further evaluation and management.  Assessment/Plan: 1. Syncope: Likely secondary to hypotension/orthostatic hypotension due to dehydration. Admitted for observation and  evaluation. CT head negative for acute findings. 2-D echo results pending. Carotid Doppler results are unchanged compared to 3 months ago. PT recommends no home health services . 2. Hyponatremia: Probably chronic with some acute component. Secondary to dehydration, thiazide diuretics-discontinued recently,? SIADH. Continue normal saline for additional 24 hours and aim to correct by no more than 10 mEq in 24 hours. Recheck BMP in a few hours. We'll check serum and urine osmolarity. Patient has been on fluid restriction and passed. Improved the sodium up from 119-123 3. Dehydration: Better. Continue IV fluids for additional day. 4. Hypokalemia: Repleted.. 5. History of lupus: Continue prednisone. Plaquenil was recently discontinued secondary to eye complications. Methotrexate was also recently discontinued. Sees rheumatologist as outpatient. 6. History of Crohn's disease: With chronic intermittent GI symptoms. Gets Remicade as outpatient. 7. Hypertension: Currently controlled. Continue Toprol. 8. Anemia: Possibly chronic. Follow CBCs in a.m.   Code Status:  Family Communication: Discussed with patient's daughter at bedside. Disposition Plan: Home? 10/20.   Consultants:  None  Procedures:  CT head  2-D echo  Carotid Dopplers  Antibiotics:  None  HPI/Subjective: Feels better. Less dizzy/lightheaded on sitting or standing. Feels nauseous on standing though. Eating better.  Objective: Filed Vitals:   05/17/12 0607 05/17/12 1216 05/17/12 1222 05/17/12 1228  BP: 137/71 143/71 150/72 144/67  Pulse: 69     Temp:      TempSrc:      Resp:      Height:      Weight:      SpO2:        Intake/Output Summary (Last 24 hours) at 05/17/12  1354 Last data filed at 05/17/12 0200  Gross per 24 hour  Intake      0 ml  Output    400 ml  Net   -400 ml   Filed Weights   05/17/12 0558 05/17/12 0600  Weight: 52.7 kg (116 lb 2.9 oz) 53.8 kg (118 lb 9.7 oz)    Exam:  General exam: Looks  better than on admission. Pleasant and comfortable. Respiratory system: Clear to auscultation. No increased work of breathing.  Cardiovascular: S1 and S2 heard, regular rate and rhythm. No murmurs, JVD or pedal edema.  Gastrointestinal system: Abdomen is nondistended, soft and nontender organomegaly masses appreciated normal bowel sounds heard.  Central system: Alert and oriented. No focal neurological deficits.  Extremities: Symmetrical 5 x 5 power. Peripheral pulses symmetrically felt.  Skin: Multiple areas of bruising. Patient has small open ulcers on the right shin and left arm which are sites of skin biopsies and are fairly clean.   Data Reviewed: Basic Metabolic Panel:  Lab 05/17/12 1610 05/16/12 2318 05/16/12 1326  NA 123* 126* 118*  K 4.1 3.9 3.0*  CL 92* 94* 81*  CO2 21 22 24   GLUCOSE 99 122* 108*  BUN 6 6 10   CREATININE 0.65 0.69 0.77  CALCIUM 7.8* 8.0* 8.5  MG -- -- 1.6  PHOS -- -- 1.4*   Liver Function Tests: No results found for this basename: AST:5,ALT:5,ALKPHOS:5,BILITOT:5,PROT:5,ALBUMIN:5 in the last 168 hours No results found for this basename: LIPASE:5,AMYLASE:5 in the last 168 hours No results found for this basename: AMMONIA:5 in the last 168 hours CBC:  Lab 05/17/12 0540 05/16/12 1620  WBC 9.4 9.6  NEUTROABS -- 8.1*  HGB 10.9* 12.3  HCT 29.5* 32.7*  MCV 86.5 84.9  PLT 246 266   Cardiac Enzymes:  Lab 05/16/12 1326  CKTOTAL --  CKMB --  CKMBINDEX --  TROPONINI <0.30   BNP (last 3 results) No results found for this basename: PROBNP:3 in the last 8760 hours CBG: No results found for this basename: GLUCAP:5 in the last 168 hours  No results found for this or any previous visit (from the past 240 hour(s)).   Studies: Ct Head Wo Contrast  05/16/2012  *RADIOLOGY REPORT*  Clinical Data: Code stroke, slurred speech, blurred vision, syncope  CT HEAD WITHOUT CONTRAST  Technique:  Contiguous axial images were obtained from the base of the skull through  the vertex without contrast.  Comparison: None.  Findings: Diffuse mild cortical volume loss noted with proportional ventricular prominence. No acute hemorrhage, acute infarction, or mass lesion is identified.  No midline shift.  No ventriculomegaly. No skull fracture.  No midline shift.  Orbits and paranasal sinuses are intact.  IMPRESSION: No acute intracranial finding. These results were called by telephone on 05/16/2012 at 12:10 p.m. to Dr. Roseanne Reno, who verbally acknowledged these results.   Original Report Authenticated By: Harrel Lemon, M.D.     Scheduled Meds:    . sodium chloride   Intravenous Once  . acidophilus  1 capsule Oral Daily  . atorvastatin  20 mg Oral Daily  . enoxaparin (LOVENOX) injection  40 mg Subcutaneous Q24H  . feeding supplement  237 mL Oral QPC lunch  . metoprolol succinate  25 mg Oral Daily  . pantoprazole  80 mg Oral Daily  . potassium chloride  60 mEq Oral Once  . predniSONE  10 mg Oral QID  . sodium chloride  3 mL Intravenous Q12H  . DISCONTD: bifidobacterium infantis  1 capsule Oral  Daily   Continuous Infusions:    . 0.9 % NaCl with KCl 20 mEq / L 75 mL/hr at 05/16/12 2036    Principal Problem:  *Syncope and collapse Active Problems:  Hypokalemia  Hyponatremia  Dehydration     [facility]  [facility] Pager [phone]. If 8PM-8AM, please contact night-coverage at [email or web address], password [facility] 05/17/2012, 1:54 PM  LOS: 1 day

## 2012-05-17 NOTE — Progress Notes (Signed)
INITIAL ADULT NUTRITION ASSESSMENT Date: 05/17/2012   Time: 12:12 PM Reason for Assessment: Nutrition Risk  ASSESSMENT: [name]  Dx: Syncope and collapse  INTERVENTION: 1. Will order patient snack once daily to increase daily caloric and protein intake.  2. Will order patient chocolate ensure once daily, provides 250 kcal and 9 grams protein daily.  3. RD to follow for nutrition plan of care.   Hx:  Past Medical History  Diagnosis Date  . Anemia   . Dermatitis   . Vertigo   . Spontaneous ecchymoses   . Carotid artery occlusion   . Hyperlipidemia   . Diverticulitis   . Fibromyalgia   . Enteritis   . Pulmonary hypertension   . Osteopenia   . Raynaud's disease   . GERD (gastroesophageal reflux disease)   . Hypertension   . Mitral valve disorders   . Enzymopathy   . Systemic lupus erythematosus   . Hyperlipidemia   . Crohn's disease   . Raynaud's disease     Related Meds:  Scheduled Meds:   . sodium chloride   Intravenous Once  . acidophilus  1 capsule Oral Daily  . atorvastatin  20 mg Oral Daily  . enoxaparin (LOVENOX) injection  40 mg Subcutaneous Q24H  . metoprolol succinate  25 mg Oral Daily  . pantoprazole  80 mg Oral Daily  . potassium chloride  60 mEq Oral Once  . predniSONE  10 mg Oral QID  . sodium chloride  1,000 mL Intravenous Once  . sodium chloride  3 mL Intravenous Q12H  . DISCONTD: bifidobacterium infantis  1 capsule Oral Daily   Continuous Infusions:   . 0.9 % NaCl with KCl 20 mEq / L 75 mL/hr at 05/16/12 2036   PRN Meds:.acetaminophen, acetaminophen, albuterol, cyclobenzaprine, fluticasone, ondansetron (ZOFRAN) IV, ondansetron, zolpidem   Ht: 5\' 4"  (162.6 cm)  Wt: 118 lb 9.7 oz (53.8 kg)  Ideal Wt: 54.5 kg % Ideal Wt: 98% Wt Readings from Last 10 Encounters:  05/17/12 118 lb 9.7 oz (53.8 kg)  04/29/12 118 lb (53.524 kg)    Usual Wt: 118 lb per patient  % Usual Wt: 100%  Body mass index is 20.36 kg/(m^2).  (WNL)  Food/Nutrition Related Hx: Patient reported her appetite has been poor for a while, about a week. She reported over the last week her PO intake has been < 25% at meals. Patient appears thin. She reported she ate a little better at breakfast this morning. Noted per MD, patient with small ulcers to right shin and left arm. Patient reported snack preference and agreed to drink chocolate Ensure to help increase PO intake.   Labs:  CMP     Component Value Date/Time   NA 123* 05/17/2012 0540   K 4.1 05/17/2012 0540   CL 92* 05/17/2012 0540   CO2 21 05/17/2012 0540   GLUCOSE 99 05/17/2012 0540   BUN 6 05/17/2012 0540   CREATININE 0.65 05/17/2012 0540   CALCIUM 7.8* 05/17/2012 0540   GFRNONAA 87* 05/17/2012 0540   GFRAA >90 05/17/2012 0540    Intake/Output Summary (Last 24 hours) at 05/17/12 1218 Last data filed at 05/17/12 0200  Gross per 24 hour  Intake      0 ml  Output    400 ml  Net   -400 ml     Diet Order: General  Supplements/Tube Feeding: none at this time   IVF:    0.9 % NaCl with KCl 20 mEq / L Last Rate:  75 mL/hr at 05/16/12 2036    Estimated Nutritional Needs:   Kcal: 1360-1630 Protein: 65-80 grams  Fluid: 1 ml per kcal intake   NUTRITION DIAGNOSIS: -Inadequate oral intake (NI-2.1).  Status: Ongoing  RELATED TO: poor appetite   AS EVIDENCE BY: pt reported poor PO intake of < 25% at meals over the past week.   MONITORING/EVALUATION(Goals): PO intake, weights, labs, skin 1. PO intake > 75% at meals, snacks and supplements. 2. Promote weight maintenance.   EDUCATION NEEDS: -No education needs identified at this time  INTERVENTION: 1. Will order patient snack once daily to increase daily caloric and protein intake.  2. Will order patient chocolate ensure once daily, provides 250 kcal and 9 grams protein daily.  3. RD to follow for nutrition plan of care.   Dietitian [phone]  DOCUMENTATION CODES Per approved criteria  -Severe malnutrition  in the context of acute illness or injury  *Patient meets malnutrition criteria due to PO intake meeting < 50% of estimated energy needs for >/= 5 days and moderate loss of body fat to the scapula.   Iven Finn [employer] 05/17/2012, 12:12 PM

## 2012-05-17 NOTE — Evaluation (Signed)
Physical Therapy Evaluation Patient Details Name: Alyssa Caldwell MRN: [id number] DOB: 1941/05/20 Today's Date: 05/17/2012 Time: 8469-6295 PT Time Calculation (min): 34 min  PT Assessment / Plan / Recommendation Clinical Impression  Patient is a 71 yo female admitted following a syncopal episode.  Tested BP - no orthostasis noted, BP remained constant.  Patient is independent with all mobility/gait.  Balance good.  No acute PT needs identified - PT will sign off.  Patient agreed with assessment/plan.    PT Assessment  Patent does not need any further PT services    Follow Up Recommendations  No PT follow up;Supervision - Intermittent    Does the patient have the potential to tolerate intense rehabilitation      Barriers to Discharge        Equipment Recommendations  None recommended by PT    Recommendations for Other Services     Frequency      Precautions / Restrictions Precautions Precautions: None Restrictions Weight Bearing Restrictions: No   Pertinent Vitals/Pain       Mobility  Bed Mobility Bed Mobility: Supine to Sit;Sit to Supine Supine to Sit: 7: Independent;HOB flat Sit to Supine: 7: Independent;HOB flat Details for Bed Mobility Assistance: No cues or assist needed Transfers Transfers: Sit to Stand;Stand to Sit Sit to Stand: 7: Independent;With upper extremity assist;From bed Stand to Sit: 7: Independent;With upper extremity assist;To bed Details for Transfer Assistance: No cues or assist needed. Ambulation/Gait Ambulation/Gait Assistance: 7: Independent Ambulation Distance (Feet): 250 Feet Assistive device: None Ambulation/Gait Assistance Details: Patient with normal gait pattern and good balance.   Gait Pattern: Within Functional Limits Gait velocity: WFL      PT Goals  N/A  Visit Information  Last PT Received On: 05/17/12 Assistance Needed: +1    Subjective Data  Subjective: "I walk a lot at home" Patient Stated Goal: To return home.     Prior Functioning  Home Living Lives With: Spouse Available Help at Discharge: Family;Available 24 hours/day Type of Home: House Home Access: Stairs to enter [employer] of Steps: 1 Entrance Stairs-Rails: None Home Layout: Two level;Bed/bath upstairs Alternate Level Stairs-Number of Steps: Flight (curved) Alternate Level Stairs-Rails: Right Bathroom Shower/Tub: Health visitor: Standard Home Adaptive Equipment: Straight cane;Walker - rolling (Lift chair) Prior Function Level of Independence: Independent Able to Take Stairs?: Yes Driving: Yes Vocation: Retired Comments: Games developer: No difficulties Dominant Hand: Right    Cognition  Overall Cognitive Status: Appears within functional limits for tasks assessed/performed Arousal/Alertness: Awake/alert Orientation Level: Oriented X4 / Intact Behavior During Session: WFL for tasks performed    Extremity/Trunk Assessment Right Upper Extremity Assessment RUE ROM/Strength/Tone: Within functional levels RUE Sensation: WFL - Light Touch Left Upper Extremity Assessment LUE ROM/Strength/Tone: Within functional levels LUE Sensation: WFL - Light Touch Right Lower Extremity Assessment RLE ROM/Strength/Tone: Within functional levels RLE Sensation: WFL - Light Touch Left Lower Extremity Assessment LLE ROM/Strength/Tone: Within functional levels LLE Sensation: WFL - Light Touch   Balance Balance Balance Assessed: Yes Dynamic Sitting Balance Dynamic Sitting - Balance Support: No upper extremity supported;Feet supported Dynamic Sitting - Level of Assistance: 7: Independent Dynamic Sitting Balance - Compensations: Patient able to maintain balance in sitting during activities. High Level Balance High Level Balance Activites: Backward walking;Turns;Direction changes;Sudden stops;Head turns High Level Balance Comments: No loss of balance during high level balance activities   End of Session PT - End of Session Activity Tolerance: Patient tolerated treatment well Patient left: in bed;with call bell/phone within  reach;with family/visitor present Nurse Communication: Mobility status  GP Functional Assessment Tool Used: Clinical judgement Functional Limitation: Mobility: Walking and moving around Mobility: Walking and Moving Around Current Status ([id number]): 0 percent impaired, limited or restricted Mobility: Walking and Moving Around Discharge Status [phone]): 0 percent impaired, limited or restricted   Vena Austria 05/17/2012, 1:29 PM Durenda Hurt. Renaldo Fiddler, [facility] Pager [phone]

## 2012-05-17 NOTE — Progress Notes (Signed)
  Echocardiogram 2D Echocardiogram has been performed.  Alyssa Caldwell 05/17/2012, 9:55 AM

## 2012-05-18 LAB — BASIC METABOLIC PANEL
BUN: 8 mg/dL (ref 6–23)
CO2: 22 mEq/L (ref 19–32)
Calcium: 8.7 mg/dL (ref 8.4–10.5)
GFR calc non Af Amer: 87 mL/min — ABNORMAL LOW (ref >90)
Glucose, Bld: 114 mg/dL — ABNORMAL HIGH (ref 70–99)
Sodium: 123 mEq/L — ABNORMAL LOW (ref 135–145)

## 2012-05-18 LAB — OSMOLALITY: Osmolality: 257 mOsm/kg — ABNORMAL LOW (ref 275–300)

## 2012-05-18 LAB — CBC
MCH: 31.6 pg (ref 26.0–34.0)
MCHC: 36.4 g/dL — ABNORMAL HIGH (ref 30.0–36.0)
MCV: 86.9 fL (ref 78.0–100.0)
Platelets: 252 10*3/uL (ref 150–400)
RBC: 3.73 MIL/uL — ABNORMAL LOW (ref 3.87–5.11)

## 2012-05-18 LAB — TSH: TSH: 0.846 u[IU]/mL (ref 0.350–4.500)

## 2012-05-18 MED ORDER — FUROSEMIDE 20 MG PO TABS
20.0000 mg | ORAL_TABLET | Freq: Every day | ORAL | Status: DC
  Administered 2012-05-18 – 2012-05-19 (×2): 20 mg via ORAL
  Filled 2012-05-18 (×2): qty 1

## 2012-05-18 NOTE — Progress Notes (Signed)
[facility] PROGRESS NOTE  Shayna Furrer EAV:[id number] DOB: 10/25/1940 DOA: 05/16/2012 PCP: No primary provider on file.   Interim history  Alyssa Caldwell is a 71 y.o. female with history of lupus, Crohn's disease, hypertension, hyperlipidemia, chronic hyponatremia was brought to the emergency department secondary to passing out. She has been unwell for approximately a week. She gives 6 days history of nausea which is worse when she is ambulating or sitting up, poor oral intake, intermittent nausea and vomiting and diarrhea. No coffee grounds, blood or melena. Denies abdominal pain. Complains of feeling hot and cold. Feels dizzy and lightheaded at times on sitting or ambulating. Today she and her husband drove from [place] to [place]. When she got out of the car and was talking to her friend, she felt lightheaded and then according to her family she had a blank stare for half a minute and then slumped is and they had to hold her. She lost consciousness for approximately 2 minutes. There was no seizure-like activity or urinary incontinence. They laid her down on the driveway. She regained consciousness. When EMS came to get her, her blood pressure was 90 mmHg per family. She denies any preceding her subsequent chest pain or palpitations. She feels generally tired all the time. Many of her GI symptoms she says are intermittent and chronic. She was last seen by her primary care physician 2 days ago when he found her sodium to be 121 and discontinued her diuretics and asked her to return in a few days for repeat BMP. She indicates that her sodiums are usually in the low 120s. Evaluation in the ED shows low sodium of 119, potassium of 3 and CT head negative for acute findings. The hospitalist service is requested to admit for further evaluation and management.  Assessment/Plan: 1. Syncope: Likely secondary to hypotension due to dehydration. Admitted for observation and evaluation. CT head negative for  acute findings. 2-D echo results unremarkable. Carotid Doppler results are unchanged compared to 3 months ago. PT recommends no home health services . Negative orthostatic blood pressures. No further workup 2. Hyponatremia: Probably chronic with some acute component. ? SIADH. With IV fluids sodium increased from 118 on admission to 123. IV fluids DC'd on 10/19 p.m. We'll fluid restrict to 1000 mL per 24 hours. Discussed with nephrology. Start Lasix 20 mg daily. Repeat BMP in a.m. and attempt to discuss with patient's PCP regarding outpatient numbers. Serum osmolarity 257 and urine osmolarity 232 3. Dehydration: Resolved. 4. Hypokalemia: Repleted.. 5. History of lupus: Continue prednisone. Plaquenil was recently discontinued secondary to eye complications. Methotrexate was also recently discontinued. Sees rheumatologist as outpatient. 6. History of Crohn's disease: With chronic intermittent GI symptoms. Gets Remicade as outpatient. 7. Hypertension: Currently controlled. Continue Toprol. 8. Anemia: Possibly chronic. Stable.   Code Status:  Family Communication: Discussed with patient's daughters and spouse at bedside. Disposition Plan: Home? 10/20.   Consultants:  None  Procedures:  CT head  2-D echo  Carotid Dopplers  Antibiotics:  None  HPI/Subjective: Feels much better. Still with intermittent nausea but no vomiting.  Objective: Filed Vitals:   05/17/12 1908 05/17/12 2104 05/18/12 0159 05/18/12 0522  BP: 137/66 155/74 133/68 126/67  Pulse: 59 61 61 58  Temp: 98 F (36.7 C) 97.3 F (36.3 C) 97.7 F (36.5 C) 97.7 F (36.5 C)  TempSrc: Oral Oral Oral Oral  Resp: 18 18 18 18   Height:      Weight:    51.2 kg (112 lb 14 oz)  SpO2: 99% 98% 100% 100%    Intake/Output Summary (Last 24 hours) at 05/18/12 1328 Last data filed at 05/18/12 0900  Gross per 24 hour  Intake      0 ml  Output    900 ml  Net   -900 ml   Filed Weights   05/17/12 0558 05/17/12 0600 05/18/12  0522  Weight: 52.7 kg (116 lb 2.9 oz) 53.8 kg (118 lb 9.7 oz) 51.2 kg (112 lb 14 oz)    Exam:  General exam: Looks better than on admission. Pleasant and comfortable. Respiratory system: Clear to auscultation. No increased work of breathing.  Cardiovascular: S1 and S2 heard, regular rate and rhythm. No murmurs, JVD or pedal edema. Telemetry shows sinus rhythm. Gastrointestinal system: Abdomen is nondistended, soft and nontender organomegaly masses appreciated normal bowel sounds heard.  Central system: Alert and oriented. No focal neurological deficits.  Extremities: Symmetrical 5 x 5 power. Peripheral pulses symmetrically felt.  Skin: Multiple areas of bruising. Patient has small open ulcers on the right shin and left arm which are sites of skin biopsies and are fairly clean.   Data Reviewed: Basic Metabolic Panel:  Lab 05/18/12 1610 05/17/12 1737 05/17/12 0540 05/16/12 2318 05/16/12 1326  NA 123* 122* 123* 126* 118*  K 3.9 3.9 4.1 3.9 3.0*  CL 90* 92* 92* 94* 81*  CO2 22 21 21 22 24   GLUCOSE 114* 130* 99 122* 108*  BUN 8 6 6 6 10   CREATININE 0.65 0.64 0.65 0.69 0.77  CALCIUM 8.7 8.4 7.8* 8.0* 8.5  MG -- -- -- -- 1.6  PHOS -- -- -- -- 1.4*   Liver Function Tests:  Lab 05/17/12 1737  AST 28  ALT 24  ALKPHOS 53  BILITOT 0.3  PROT 6.8  ALBUMIN 3.6   No results found for this basename: LIPASE:5,AMYLASE:5 in the last 168 hours No results found for this basename: AMMONIA:5 in the last 168 hours CBC:  Lab 05/18/12 0530 05/17/12 0540 05/16/12 1620  WBC 9.1 9.4 9.6  NEUTROABS -- -- 8.1*  HGB 11.8* 10.9* 12.3  HCT 32.4* 29.5* 32.7*  MCV 86.9 86.5 84.9  PLT 252 246 266   Cardiac Enzymes:  Lab 05/16/12 1326  CKTOTAL --  CKMB --  CKMBINDEX --  TROPONINI <0.30   BNP (last 3 results) No results found for this basename: PROBNP:3 in the last 8760 hours CBG: No results found for this basename: GLUCAP:5 in the last 168 hours  No results found for this or any previous  visit (from the past 240 hour(s)).   Studies: No results found.  Scheduled Meds:    . acidophilus  1 capsule Oral Daily  . atorvastatin  20 mg Oral Daily  . enoxaparin (LOVENOX) injection  40 mg Subcutaneous Q24H  . feeding supplement  237 mL Oral QPC lunch  . furosemide  20 mg Oral Daily  . metoprolol succinate  25 mg Oral Daily  . pantoprazole  80 mg Oral Daily  . predniSONE  10 mg Oral QID  . sodium chloride  3 mL Intravenous Q12H   Continuous Infusions:    . DISCONTD: 0.9 % NaCl with KCl 20 mEq / L 75 mL/hr at 05/16/12 2036    Principal Problem:  *Syncope and collapse Active Problems:  Hypokalemia  Hyponatremia  Dehydration     [facility]  [facility] Pager [phone]. If 8PM-8AM, please contact night-coverage at [email or web address], password [facility] 05/18/2012, 1:28 PM  LOS: 2 days

## 2012-05-19 ENCOUNTER — Inpatient Hospital Stay (HOSPITAL_COMMUNITY): Payer: Medicare Other

## 2012-05-19 DIAGNOSIS — R110 Nausea: Secondary | ICD-10-CM

## 2012-05-19 LAB — BASIC METABOLIC PANEL
BUN: 13 mg/dL (ref 6–23)
CO2: 24 mEq/L (ref 19–32)
Calcium: 8.9 mg/dL (ref 8.4–10.5)
Chloride: 87 mEq/L — ABNORMAL LOW (ref 96–112)
Creatinine, Ser: 0.76 mg/dL (ref 0.50–1.10)

## 2012-05-19 LAB — CORTISOL: Cortisol, Plasma: 30 ug/dL

## 2012-05-19 MED ORDER — SODIUM CHLORIDE 0.9 % IV SOLN
INTRAVENOUS | Status: DC
  Administered 2012-05-19: 16:00:00 via INTRAVENOUS

## 2012-05-19 NOTE — Progress Notes (Signed)
[facility] PROGRESS NOTE  Jakelyn Bridgers NGE:[id number] DOB: [birth year] DOA: 05/16/2012 PCP: No primary provider on file.   Interim history  Alyssa Caldwell is a 71 y.o. female with history of lupus, Crohn's disease, hypertension, hyperlipidemia, chronic hyponatremia was brought to the emergency department secondary to passing out. She has been unwell for approximately a week. She gives 6 days history of nausea which is worse when she is ambulating or sitting up, poor oral intake, intermittent nausea and vomiting and diarrhea. No coffee grounds, blood or melena. Denies abdominal pain. Complains of feeling hot and cold. Feels dizzy and lightheaded at times on sitting or ambulating. Today she and her husband drove from [place] to [place]. When she got out of the car and was talking to her friend, she felt lightheaded and then according to her family she had a blank stare for half a minute and then slumped is and they had to hold her. She lost consciousness for approximately 2 minutes. There was no seizure-like activity or urinary incontinence. They laid her down on the driveway. She regained consciousness. When EMS came to get her, her blood pressure was 90 mmHg per family. She denies any preceding her subsequent chest pain or palpitations. She feels generally tired all the time. Many of her GI symptoms she says are intermittent and chronic. She was last seen by her primary care physician 2 days ago when he found her sodium to be 121 and discontinued her diuretics and asked her to return in a few days for repeat BMP. She indicates that her sodiums are usually in the low 120s. Evaluation in the ED shows low sodium of 119, potassium of 3 and CT head negative for acute findings. The hospitalist service is requested to admit for further evaluation and management.  Assessment/Plan: 1. Syncope: Likely secondary to hypotension due to dehydration. Admitted for observation and evaluation. CT head negative for  acute findings. 2-D echo results unremarkable. Carotid Doppler results are unchanged compared to 3 months ago. PT recommends no home health services . Negative orthostatic blood pressures. No further workup 2. Hyponatremia: Probably chronic with some acute component. ? SIADH. With IV fluids sodium increased from 118 on admission to 123. IV fluids DC'd on 10/19 p.m. Fluid restrict. Started Lasix 20 mg qd on 10/20. Sodium 121 from 122. Nephrology consulted for assistance in evaluate and Mx. ? Increase Lasix vs Tolvaptan. D/W Dr. Caryn Section. Requested PCP office records. 3. Ongoing Nausea: worsened over last 2-3 weeks. Acute on chronic. Unclear etiology. Tolerating small amount by mouth. No vomiting. Op GI work up. 4. Dehydration: Resolved. 5. Hypokalemia: Repleted.. 6. History of lupus: Continue prednisone. Plaquenil and Methotrexate was also recently discontinued. Sees rheumatologist as outpatient. 7. History of Crohn's disease: With chronic intermittent GI symptoms. Gets Remicade as outpatient. 8. Hypertension: Currently controlled. Continue Toprol. 9. Anemia: Possibly chronic. Stable.   Code Status:  Family Communication: Discussed with patient's spouse at bedside. Disposition Plan: to be determined pending improvement of sodium.   Consultants:  Nephrology  Procedures:  CT head  2-D echo  Carotid Dopplers  Antibiotics:  None  HPI/Subjective: Still intermittent nausea, but no vomiting. Tolerating oral intake  Objective: Filed Vitals:   05/18/12 2130 05/19/12 0554 05/19/12 0555 05/19/12 0556  BP: 131/67 129/72 127/83 126/74  Pulse: 67 61 72 70  Temp: 98.4 F (36.9 C) 98 F (36.7 C)    TempSrc: Oral Oral    Resp: 18 20 18 18   Height:      Weight:  46.2 kg (101 lb 13.6 oz)  SpO2: 99% 100% 99% 99%   No intake or output data in the 24 hours ending 05/19/12 1209 Filed Weights   05/17/12 0600 05/18/12 0522 05/19/12 0556  Weight: 53.8 kg (118 lb 9.7 oz) 51.2 kg (112 lb 14 oz)  46.2 kg (101 lb 13.6 oz)    Exam:  General exam: comfortable. Respiratory system: Clear to auscultation. No increased work of breathing.  Cardiovascular: S1 and S2 heard, regular rate and rhythm. No murmurs, JVD or pedal edema.  Gastrointestinal system: Abdomen is nondistended, soft and nontender organomegaly masses appreciated normal bowel sounds heard.  Central system: Alert and oriented. No focal neurological deficits.  Extremities: Symmetrical 5 x 5 power. Peripheral pulses symmetrically felt.   Data Reviewed: Basic Metabolic Panel:  Lab 05/19/12 1610 05/18/12 0530 05/17/12 1737 05/17/12 0540 05/16/12 2318 05/16/12 1326  NA 121* 123* 122* 123* 126* --  K 3.9 3.9 3.9 4.1 3.9 --  CL 87* 90* 92* 92* 94* --  CO2 24 22 21 21 22  --  GLUCOSE 117* 114* 130* 99 122* --  BUN 13 8 6 6 6  --  CREATININE 0.76 0.65 0.64 0.65 0.69 --  CALCIUM 8.9 8.7 8.4 7.8* 8.0* --  MG -- -- -- -- -- 1.6  PHOS -- -- -- -- -- 1.4*   Liver Function Tests:  Lab 05/17/12 1737  AST 28  ALT 24  ALKPHOS 53  BILITOT 0.3  PROT 6.8  ALBUMIN 3.6   No results found for this basename: LIPASE:5,AMYLASE:5 in the last 168 hours No results found for this basename: AMMONIA:5 in the last 168 hours CBC:  Lab 05/18/12 0530 05/17/12 0540 05/16/12 1620  WBC 9.1 9.4 9.6  NEUTROABS -- -- 8.1*  HGB 11.8* 10.9* 12.3  HCT 32.4* 29.5* 32.7*  MCV 86.9 86.5 84.9  PLT 252 246 266   Cardiac Enzymes:  Lab 05/16/12 1326  CKTOTAL --  CKMB --  CKMBINDEX --  TROPONINI <0.30   BNP (last 3 results) No results found for this basename: PROBNP:3 in the last 8760 hours CBG: No results found for this basename: GLUCAP:5 in the last 168 hours  No results found for this or any previous visit (from the past 240 hour(s)).   Studies: No results found.  Scheduled Meds:    . acidophilus  1 capsule Oral Daily  . atorvastatin  20 mg Oral Daily  . enoxaparin (LOVENOX) injection  40 mg Subcutaneous Q24H  . feeding supplement   237 mL Oral QPC lunch  . furosemide  20 mg Oral Daily  . metoprolol succinate  25 mg Oral Daily  . pantoprazole  80 mg Oral Daily  . predniSONE  10 mg Oral QID  . sodium chloride  3 mL Intravenous Q12H   Continuous Infusions:    . DISCONTD: 0.9 % NaCl with KCl 20 mEq / L 75 mL/hr at 05/16/12 2036    Principal Problem:  *Syncope and collapse Active Problems:  Hypokalemia  Hyponatremia  Dehydration  Nausea     [facility]  [facility] Pager [phone]. If 8PM-8AM, please contact night-coverage at [email or web address], password [facility] 05/19/2012, 12:09 PM  LOS: 3 days

## 2012-05-19 NOTE — Progress Notes (Signed)
Alyssa Caldwell        RENAL CONSULT   Reason for Consult:Hyponatremia Referring Physician: Dr. Lorenda Peck Alyssa Caldwell is a 71 y.o. white woman admitted 18 Oct with syncope.  She had nausea and vomiting last week (Wed-Fri) and had syncopal episode Friday(18 Oct) while here in [place] visiting her daughter.  She is still nauseated.   Her doctor in [place] (Alyssa Bang, Alyssa Caldwell) sent records.  [Na] was 121 on 15 May 2012--prior [Na] was 130 on 08 Jun 2010 (none in between).    She had U osm 232 on 19 Oct, U Na not done.  Urine SG was 1.008 on 18 Oct (not concentrated) She was given NS first day of admission, then was given furosemide yesterday (20 Oct) and is on 20 mg/day po currently.  Results of [Na] over last several day are as follows:  Date   [Na] 18 Oct  118 19 Oct  123 20 Oct  123 21 Oct  121 Renal consult was requested by Alyssa Caldwell  Past Medical History  Diagnosis Date  . Anemia   . Dermatitis   . Vertigo   . Spontaneous ecchymoses   . Carotid artery occlusion   . Hyperlipidemia   . Diverticulitis   . Fibromyalgia   . Enteritis   . Pulmonary hypertension   . Osteopenia   . Raynaud's disease   . GERD (gastroesophageal reflux disease)   . Hypertension   . Mitral valve disorders   . Enzymopathy   . Systemic lupus erythematosus   . Hyperlipidemia   . Crohn's disease   . Raynaud's disease     Family History  Problem Relation Age of Onset  . Stroke Father   . Heart disease Father     Social History: Born in [place] graduated from [employer].  Went to [employer] for 1 yr., Lives with husband (2nd), married 21yr, smokes 20 pack years (none in 12 yr), drinks once a week  Allergies:  Allergies  Allergen Reactions  . Codeine   . Lyrica (Pregabalin)   . Plaquenil (Hydroxychloroquine) Other (See Comments)    Vision problems  . Tape     Very sensitive to most tapes       ROS- no angina, no claudication, no melena, no hematochezia, no gross  hematuria, no renal colic, no nocturia, no doe, no orthopnea, no purulent sputum, no hemoptysis, no weight gain or weight loss, no cold or heat intolerance. She has very fragile skin  Blood pressure 135/74, pulse 64, temperature 97.5 F (36.4 C), temperature source Oral, resp. rate 18, height 5\' 4"  (1.626 m), weight 46.2 kg (101 lb 13.6 oz), SpO2 99.00%. Gen-- awake, alert, friendly, cooperative  Skin--numerous ecchymoses on ant chest, arms and legs Chest--clear Heart--no rub Abd--scars present from prior operations GU/rectal--not done Extr-ecchymoses as noted above, no edema, no active arthritis, no rash Neuro--R handed, strength equal, sensation ok  LAB: Hgb 11.8, WBC 9100, plts 252K Na 121, K 3.9, Cl 89, CO2 24, BUN 13, Cr 0.76, Ca 8.9 phosphorus 1.4, Mg NA TSH 0.84 U Osm 232 on 19 Oct   CT Head--no mass, no bleed CXR--not done  Assessment/Plan: Hyponatremia in pt with prior hx SLE (chronic steroids) admitted with N & vomiting.   U osm was low on admission (indicating no excess ADH)  Rec--Restart IV NS and check CXR . Hold furosemide for now.  Alyssa Caldwell F 05/19/2012, 3:28 PM

## 2012-05-20 LAB — BASIC METABOLIC PANEL
BUN: 15 mg/dL (ref 6–23)
Chloride: 90 mEq/L — ABNORMAL LOW (ref 96–112)
Creatinine, Ser: 0.74 mg/dL (ref 0.50–1.10)
Glucose, Bld: 107 mg/dL — ABNORMAL HIGH (ref 70–99)
Potassium: 3.8 mEq/L (ref 3.5–5.1)

## 2012-05-20 MED ORDER — ENSURE COMPLETE PO LIQD: 237.0000 mL | Freq: Every day | ORAL | Status: AC

## 2012-05-20 NOTE — Progress Notes (Signed)
[facility]  Subjective:  Awake, alert, wants to go home, husband at bedside.  IV NS  40 cc/hr   Objective: Vital signs in last 24 hours: Blood pressure 142/71, pulse 65, temperature 97.7 F (36.5 C), temperature source Oral, resp. rate 18, height 5\' 4"  (1.626 m), weight 50.1 kg (110 lb 7.2 oz), SpO2 100.00%.    PHYSICAL EXAM General--as above Chest--clear Heart--no rub Abd--nontender Extr--no edema  Lab Results:   Lab 05/20/12 0525 05/19/12 0500 05/18/12 0530 05/16/12 1326  NA 127* 121* 123* --  K 3.8 3.9 3.9 --  CL 90* 87* 90* --  CO2 25 24 22  --  BUN 15 13 8  --  CREATININE 0.74 0.76 0.65 --  ALB -- -- -- --  GLUCOSE 107* -- -- --  CALCIUM 8.3* 8.9 8.7 --  PHOS -- -- -- 1.4*     I have reviewed the patient's current medications. Assessment/Plan: Hyponatremia improving with NS.  She says she has app't with her primary care doctor  Bretta Bang., DO) on Thursday.  She should have BMET done there and d/c summary should accompany her when she goes to see him. OK with me for her to be d/c'd as long as she has close f/u with her doc.       LOS: 4 days   Nisha Dhami F 05/20/2012,2:09 PM   .labalb

## 2012-05-20 NOTE — Discharge Summary (Addendum)
Physician Discharge Summary  Alyssa Caldwell RUE:[id number] DOB: [birth year] DOA: 05/16/2012  PCP: No primary provider on file.  Admit date: 05/16/2012 Discharge date: 05/20/2012  Time spent: greater than 30 minutes  Recommendations for Outpatient Follow-up:  1. Followup with Dr. Bretta Bang, PCP on 10/24 with repeat BMP. Patient has a prior appointment.  Discharge Diagnoses:  Principal Problem:  *Syncope and collapse Active Problems:  Hypokalemia  Hyponatremia  Dehydration  Nausea   Discharge Condition: Improved and stable.  Diet recommendation: Heart healthy  Filed Weights   05/18/12 0522 05/19/12 0556 05/20/12 0500  Weight: 51.2 kg (112 lb 14 oz) 46.2 kg (101 lb 13.6 oz) 50.1 kg (110 lb 7.2 oz)    History of present illness:  Alyssa Caldwell is a 71 y.o. female with history of lupus, Crohn's disease, hypertension, hyperlipidemia, chronic hyponatremia was brought to the emergency department secondary to passing out. She has been unwell for approximately a week. She gives 6 days history of nausea which is worse when she is ambulating or sitting up, poor oral intake, intermittent nausea and vomiting and diarrhea. No coffee grounds, blood or melena. Denies abdominal pain. Complains of feeling hot and cold. Feels dizzy and lightheaded at times on sitting or ambulating. Today she and her husband drove from [place] to [place]. When she got out of the car and was talking to her friend, she felt lightheaded and then according to her family she had a blank stare for half a minute and then slumped is and they had to hold her. She lost consciousness for approximately 2 minutes. There was no seizure-like activity or urinary incontinence. They laid her down on the driveway. She regained consciousness. When EMS came to get her, her blood pressure was 90 mmHg per family. She denies any preceding her subsequent chest pain or palpitations. She feels generally tired all the time. Many of her GI  symptoms she says are intermittent and chronic. She was last seen by her primary care physician 2 days ago when he found her sodium to be 121 and discontinued her diuretics and asked her to return in a few days for repeat BMP. She indicates that her sodiums are usually in the low 120s. Evaluation in the ED shows low sodium of 119, potassium of 3 and CT head negative for acute findings. The hospitalist service is requested to admit for further evaluation and management.   Hospital Course:  1. Syncope: Likely secondary to hypotension due to dehydration. Admitted for observation and evaluation. CT head negative for acute findings. 2-D echo results unremarkable. Carotid Doppler results are unchanged compared to 3 months ago. PT recommends no home health services . Negative orthostatic blood pressures. No further workup. 2. Hyponatremia: Probably chronic with some acute component. Initially it was felt to be secondary to dehydration from recent nausea, vomiting, diarrhea and poor oral intake complicating recent use of chlorthalidone. She was hydrated with IV normal saline with only minimal improvement in her serum sodium. At this time SIADH was suspected and IV fluids were discontinued. Patient was placed on fluid restriction and oral Lasix was started. However there was no change in sodium. Nephrology was consulted. They indicated that her picture was not consistent with SIADH. Lasix was discontinued and IV normal saline was started again. After approximately 24 hours, her sodium has increased from 121 to 127. Nephrology has seen her again today and has cleared her for discharge as long as she follows up closely with her PCP. Patient has indicated that she has  an appointment to see her primary care physician on Thursday-she is advised to followup with repeat BMP. Unclear as to exact etiology of her hyponatremia. Office records from her PCP were reviewed. 3. Ongoing Nausea: worsened over last 2-3 weeks. Acute on  chronic. Unclear etiology. Tolerating by mouth. No vomiting. Op GI followup recommended. 4. Dehydration: Resolved. 5. Hypokalemia: Repleted.. 6. History of lupus: Continue prednisone. Plaquenil and Methotrexate were recently discontinued. Sees rheumatologist as outpatient. 7. History of Crohn's disease: With chronic intermittent GI symptoms. Gets Remicade as outpatient. 8. Hypertension: Currently controlled. Continue Toprol. 9. Anemia: Possibly chronic. Stable.  Consultants:  Nephrology  Procedures:  CT head  2-D echo  Carotid Dopplers  Antibiotics:  None  Discharge Exam:  Complaints Patient continues to complain of some nausea in the upright position-while standing or walking. Denies dizziness or lightheadedness. No vomiting or abdominal pain. Eager to go home.  Filed Vitals:   05/19/12 1416 05/19/12 2200 05/20/12 0500 05/20/12 0600  BP: 135/74 154/78  142/71  Pulse: 64 63  65  Temp: 97.5 F (36.4 C) 97.3 F (36.3 C)  97.7 F (36.5 C)  TempSrc: Oral Oral  Oral  Resp: 18 19  18   Height:      Weight:   50.1 kg (110 lb 7.2 oz)   SpO2: 99% 100%  100%    General exam: comfortable.  Respiratory system: Clear to auscultation. No increased work of breathing.  Cardiovascular: S1 and S2 heard, regular rate and rhythm. No murmurs, JVD or pedal edema.  Gastrointestinal system: Abdomen is nondistended, soft and nontender organomegaly masses appreciated normal bowel sounds heard.  Central system: Alert and oriented. No focal neurological deficits.  Extremities: Symmetrical 5 x 5 power. Peripheral pulses symmetrically felt.   Discharge Instructions      Discharge Orders    Future Appointments: Provider: Department: Dept Phone: Center:   09/02/2012 10:00 AM [facility] [phone] [facility]   09/02/2012 11:00 AM Larina Earthly, MD [facility] [phone] [facility]     Future Orders Please Complete By Expires   Diet - low sodium heart healthy      Increase activity slowly       Call MD for:  persistant dizziness or light-headedness      Call MD for:  extreme fatigue      Call MD for:  persistant nausea and vomiting          Medication List     As of 05/20/2012  3:38 PM    STOP taking these medications         chlorthalidone 25 MG tablet   Commonly known as: HYGROTON      TAKE these medications         ALIGN PO   Take 1 capsule by mouth daily.      atorvastatin 20 MG tablet   Commonly known as: LIPITOR   Take 20 mg by mouth daily.      beta carotene w/minerals tablet   Take 1 tablet by mouth daily.      calcium carbonate 500 MG chewable tablet   Commonly known as: TUMS - dosed in mg elemental calcium   Chew 3,000 mg by mouth daily.      cyclobenzaprine 10 MG tablet   Commonly known as: FLEXERIL   Take 10 mg by mouth 3 (three) times daily as needed. For muscle spasms      feeding supplement Liqd   Take 237 mLs by mouth daily after lunch.  fluticasone 50 MCG/ACT nasal spray   Commonly known as: FLONASE   Place 2 sprays into the nose daily as needed. For allergies      ibandronate 150 MG tablet   Commonly known as: BONIVA   Take 150 mg by mouth every 30 (thirty) days. Take in the morning with a full glass of water, on an empty stomach, and do not take anything else by mouth or lie down for the next 30 min.      metoprolol succinate 25 MG 24 hr tablet   Commonly known as: TOPROL-XL   Take 25 mg by mouth daily.      omeprazole 40 MG capsule   Commonly known as: PRILOSEC   Take 40 mg by mouth daily.      ondansetron 4 MG tablet   Commonly known as: ZOFRAN   Take 4 mg by mouth 4 (four) times daily as needed. For nausea      predniSONE 10 MG tablet   Commonly known as: DELTASONE   Take 10 mg by mouth 4 (four) times daily.      REMICADE 100 MG injection   Generic drug: inFLIXimab   Inject 100 mg into the vein. Every six weeks 100mg  IV      Vitamin D3 5000 UNITS Caps   Take 2,500 Units by mouth daily.      zolpidem 5 MG  tablet   Commonly known as: AMBIEN   Take 5 mg by mouth at bedtime as needed. For sleep        Follow-up Information    Follow up with Dr. Bretta Bang (Primary Medical Doctor). On 05/22/2012. (Keep up appointment. To be seen with labs (BMP))           The results of significant diagnostics from this hospitalization (including imaging, microbiology, ancillary and laboratory) are listed below for reference.    Significant Diagnostic Studies: Dg Chest 2 View  05/19/2012  *RADIOLOGY REPORT*  Clinical Data: Low sodium.  Syncope 3 days ago.  History hypertension, lupus, Crohn's.  CHEST - 2 VIEW  Comparison: None.  Findings: Cardiomediastinal silhouette is within normal limits. The lungs are free of focal consolidations and pleural effusions. No edema.  Mild degenerative changes are seen in the spine. Surgical clips are present in the upper abdomen.  IMPRESSION: No evidence for acute cardiopulmonary abnormality.   Original Report Authenticated By: Patterson Hammersmith, M.D.    Ct Head Wo Contrast  05/16/2012  *RADIOLOGY REPORT*  Clinical Data: Code stroke, slurred speech, blurred vision, syncope  CT HEAD WITHOUT CONTRAST  Technique:  Contiguous axial images were obtained from the base of the skull through the vertex without contrast.  Comparison: None.  Findings: Diffuse mild cortical volume loss noted with proportional ventricular prominence. No acute hemorrhage, acute infarction, or mass lesion is identified.  No midline shift.  No ventriculomegaly. No skull fracture.  No midline shift.  Orbits and paranasal sinuses are intact.  IMPRESSION: No acute intracranial finding. These results were called by telephone on 05/16/2012 at 12:10 p.m. to Dr. Roseanne Reno, who verbally acknowledged these results.   Original Report Authenticated By: Harrel Lemon, M.D.    2-D echo  Study Conclusions  - Left ventricle: The cavity size was normal. Systolic function was normal. The estimated ejection  fraction was in the range of 55% to 60%. Although no diagnostic regional wall motion abnormality was identified, this possibility cannot be completely excluded on the basis of this study. There was an increased relative  contribution of atrial contraction to ventricular filling, which may be seen with aging. - Aortic valve: Mild regurgitation. - Mitral valve: Mild regurgitation directed centrally. - Pulmonary arteries: PA peak pressure: 31mm Hg (S).  Carotid Dopplers  Summary: Right= no significant internal carotid artery stenosis.  Left= 60-79% internal carotid artery stenosis, low to mid end of scale. Antegrade vertebral flow.   Microbiology: No results found for this or any previous visit (from the past 240 hour(s)).   Labs: Basic Metabolic Panel:  Lab 05/20/12 9604 05/19/12 0500 05/18/12 0530 05/17/12 1737 05/17/12 0540 05/16/12 1326  NA 127* 121* 123* 122* 123* --  K 3.8 3.9 3.9 3.9 4.1 --  CL 90* 87* 90* 92* 92* --  CO2 25 24 22 21 21  --  GLUCOSE 107* 117* 114* 130* 99 --  BUN 15 13 8 6 6  --  CREATININE 0.74 0.76 0.65 0.64 0.65 --  CALCIUM 8.3* 8.9 8.7 8.4 7.8* --  MG -- -- -- -- -- 1.6  PHOS -- -- -- -- -- 1.4*   Liver Function Tests:  Lab 05/17/12 1737  AST 28  ALT 24  ALKPHOS 53  BILITOT 0.3  PROT 6.8  ALBUMIN 3.6   No results found for this basename: LIPASE:5,AMYLASE:5 in the last 168 hours No results found for this basename: AMMONIA:5 in the last 168 hours CBC:  Lab 05/18/12 0530 05/17/12 0540 05/16/12 1620  WBC 9.1 9.4 9.6  NEUTROABS -- -- 8.1*  HGB 11.8* 10.9* 12.3  HCT 32.4* 29.5* 32.7*  MCV 86.9 86.5 84.9  PLT 252 246 266   Cardiac Enzymes:  Lab 05/16/12 1326  CKTOTAL --  CKMB --  CKMBINDEX --  TROPONINI <0.30   BNP: BNP (last 3 results) No results found for this basename: PROBNP:3 in the last 8760 hours CBG: No results found for this basename: GLUCAP:5 in the last 168 hours  Other lab data   Serum uric acid: 2.3.  Serum  osmolarity: 257.  Magnesium: 1.6.  Random cortisol: 30.  TSH: 0.846.  Urine osmolarity: 232.  UA: Negative for features of UTI.   Signed:  Mckenize Mezera  [facility] 05/20/2012, 3:38 PM  Addendum:  Patient meets criteria for Severe malnutrition in the context of acute illness or injury.  Marcellus Scott, MD 05/23/2012

## 2012-05-20 NOTE — Clinical Documentation Improvement (Signed)
MALNUTRITION DOCUMENTATION CLARIFICATION  THIS DOCUMENT IS NOT A PERMANENT PART OF THE MEDICAL RECORD  Please update your documentation within the medical record to reflect your response to this query.                                                                                        05/20/12   Dear Dr.Hongalgi,  In a better effort to capture your patient's severity of illness, reflect appropriate length of stay and utilization of resources, a review of the patient medical record has revealed the following indicators.   Based on your clinical judgment, please clarify and document in a progress note and/or discharge summary the clinical condition associated with the following supporting information: In responding to this query please exercise your independent judgment.  The fact that a query is asked, does not imply that any particular answer is desired or expected.    "Severe Malnutrition in the context of acute illness or injury " was documented by the nutritionist on 10/19. If your clinical findings/judgment agrees with their diagnosis, could you please document the related diagnosis in the progress note(s) and discharge summary. THANK YOU!    Possible Clinical Conditions?  - Severe Malnutrition  - Other condition (please document in the progress notes and/or discharge summary)  - Cannot Clinically determine at this time   Supporting Information:  INITIAL ADULT NUTRITION ASSESSMENT  Date: 05/17/2012 Time: 12:12 PM -Severe malnutrition in the context of acute illness or injury   *Patient meets malnutrition criteria due to PO intake meeting < 50% of estimated energy needs for >/= 5 days and moderate loss of body fat to the scapula.  INTERVENTION:  1. Will order patient snack once daily to increase daily caloric and protein intake.  2. Will order patient chocolate ensure once daily, provides 250 kcal and 9 grams protein daily.  3. RD to follow for nutrition plan of care.        ** You may use possible, probable, or suspect with inpatient documentation. possible, probable, suspected diagnoses MUST be documented at the time of discharge.   No additional documentation in chart upon review. SM   Thank You,  Saul Fordyce  Clinical Documentation Specialist: [phone] Pager  Health Information Management [place]

## 2012-05-21 NOTE — Care Management Note (Signed)
    Page 1 of 1   05/21/2012     8:06:37 AM   CARE MANAGEMENT NOTE 05/21/2012  Patient:  Alyssa Caldwell, Alyssa Caldwell   Account Number:  [id number]  Date Initiated:  05/19/2012  Documentation initiated by:  [facility]  Subjective/Objective Assessment:   Admitted with syncope. Lives with spouse, independent with ambulation and ADLS prior to admission     Action/Plan:   PT eval- no d/c needs identified   Anticipated DC Date:  05/20/2012   Anticipated DC Plan:  HOME/SELF CARE      DC Planning Services  CM consult      Choice offered to / List presented to:             Status of service:  Completed, signed off Medicare Important Message given?   (If response is "NO", the following Medicare IM given date fields will be blank) Date Medicare IM given:   Date Additional Medicare IM given:    Discharge Disposition:  HOME/SELF CARE  Per UR Regulation:  Reviewed for med. necessity/level of care/duration of stay  If discussed at Long Length of Stay Meetings, dates discussed:    Comments:

## 2012-07-30 DIAGNOSIS — S4291XA Fracture of right shoulder girdle, part unspecified, initial encounter for closed fracture: Secondary | ICD-10-CM

## 2012-07-30 HISTORY — DX: Fracture of right shoulder girdle, part unspecified, initial encounter for closed fracture: S42.91XA

## 2012-09-01 ENCOUNTER — Encounter: Payer: Self-pay | Admitting: Vascular Surgery

## 2012-09-02 ENCOUNTER — Encounter: Payer: Self-pay | Admitting: Vascular Surgery

## 2012-09-02 ENCOUNTER — Ambulatory Visit (INDEPENDENT_AMBULATORY_CARE_PROVIDER_SITE_OTHER): Payer: Medicare Other | Admitting: Vascular Surgery

## 2012-09-02 VITALS — BP 176/105 | HR 63 | Resp 18 | Ht 64.0 in | Wt 118.2 lb

## 2012-09-02 DIAGNOSIS — I6529 Occlusion and stenosis of unspecified carotid artery: Secondary | ICD-10-CM

## 2012-09-02 NOTE — Progress Notes (Signed)
Carotid duplex performed @ [facility] 09/02/2012

## 2012-09-02 NOTE — Progress Notes (Signed)
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 I've seen her in October of 2013 where she had an outlying ultrasound suggesting 60-79% stenosis of her left internal carotid artery. This was at the lower end of the scale. Since that time she has had no new focal neurologic deficits. She did trip and fall having injury to her right shoulder which is being treated with physical therapy. She also had a reaction to drugs for lupus causing hyponatremia and had syncope related to this. This has improved as well. She specifically denies any focal hemispheric weakness and aphasia or new visual problems  Past Medical History  Diagnosis Date  . Anemia   . Dermatitis   . Vertigo   . Spontaneous ecchymoses   . Carotid artery occlusion   . Hyperlipidemia   . Diverticulitis   . Fibromyalgia   . Enteritis   . Pulmonary hypertension   . Osteopenia   . Raynaud's disease   . GERD (gastroesophageal reflux disease)   . Hypertension   . Mitral valve disorders   . Enzymopathy   . Systemic lupus erythematosus   . Hyperlipidemia   . Crohn's disease   . Raynaud's disease   . Shoulder fracture, right 07-2012  . Low sodium levels   . Hiatal hernia     History  Substance Use Topics  . Smoking status: Former Smoker    Types: Cigarettes    Quit date: 04/25/2002  . Smokeless tobacco: Not on file  . Alcohol Use: Yes    Family History  Problem Relation Age of Onset  . Stroke Father   . Heart disease Father     Allergies  Allergen Reactions  . Codeine   . Lyrica (Pregabalin)   . Plaquenil (Hydroxychloroquine) Other (See Comments)    Vision problems  . Tape     Very sensitive to most tapes    Current outpatient prescriptions:atorvastatin (LIPITOR) 20 MG tablet, Take 20 mg by mouth daily., Disp: , Rfl: ;  beta carotene w/minerals (OCUVITE) tablet, Take 1 tablet by mouth daily., Disp: , Rfl: ;  calcium carbonate (TUMS - DOSED IN MG ELEMENTAL  CALCIUM) 500 MG chewable tablet, Chew 3,000 mg by mouth daily., Disp: , Rfl: ;  Cholecalciferol (VITAMIN D3) 5000 UNITS CAPS, Take 2,500 Units by mouth daily. , Disp: , Rfl:  Clobetasol Propionate (CLOBEX SPRAY EX), Apply topically as needed., Disp: , Rfl: ;  Cyanocobalamin (VITAMIN B-12 PO), Take 2,500 mg by mouth daily., Disp: , Rfl: ;  cyclobenzaprine (FLEXERIL) 10 MG tablet, Take 10 mg by mouth 3 (three) times daily as needed. For muscle spasms, Disp: , Rfl: ;  feeding supplement (ENSURE COMPLETE) LIQD, Take 237 mLs by mouth daily after lunch., Disp: , Rfl:  fluticasone (FLONASE) 50 MCG/ACT nasal spray, Place 2 sprays into the nose daily as needed. For allergies, Disp: , Rfl: ;  ibandronate (BONIVA) 150 MG tablet, Take 150 mg by mouth every 30 (thirty) days. Take in the morning with a full glass of water, on an empty stomach, and do not take anything else by mouth or lie down for the next 30 min., Disp: , Rfl:  ibuprofen (ADVIL,MOTRIN) 400 MG tablet, Take 400 mg by mouth every 6 (six) hours as needed., Disp: , Rfl: ;  inFLIXimab (REMICADE) 100 MG injection, Inject 100 mg into the vein. Every six weeks 100mg  IV, Disp: , Rfl: ;  metoprolol succinate (TOPROL-XL) 25 MG 24 hr tablet, Take 25 mg by mouth daily.,  Disp: , Rfl: ;  ondansetron (ZOFRAN) 4 MG tablet, Take 4 mg by mouth 4 (four) times daily as needed. For nausea, Disp: , Rfl:  Probiotic Product (ALIGN PO), Take 1 capsule by mouth daily. , Disp: , Rfl: ;  ranitidine (ZANTAC) 150 MG tablet, Take 150 mg by mouth 2 (two) times daily., Disp: , Rfl: ;  zolpidem (AMBIEN) 5 MG tablet, Take 5 mg by mouth at bedtime as needed. For sleep, Disp: , Rfl: ;  omeprazole (PRILOSEC) 40 MG capsule, Take 40 mg by mouth daily., Disp: , Rfl: ;  predniSONE (DELTASONE) 10 MG tablet, Take 10 mg by mouth 4 (four) times daily. , Disp: , Rfl:   BP 176/105  Pulse 63  Resp 18  Ht 5\' 4"  (1.626 m)  Wt 118 lb 3.2 oz (53.615 kg)  BMI 20.29 kg/m2  Body mass index is 20.29  kg/(m^2).       Physical exam well-developed well-nourished white female in no acute distress HEENT is normal Carotid arteries without bruits bilaterally with normal pulsation 2+ radial pulses bilaterally Neurologically she is grossly intact Her affect is normal she is alert and oriented  Carotid duplex today reveals left internal carotid stenosis in the upper end of the 40-59% range and no significant stenosis on the right  Impression and plan: Moderate left internal carotid artery stenosis, asymptomatic. I recommended continued yearly carotid duplex to rule out any progression. I again reviewed symptoms of carotid disease with her. She will see [place] again in one year

## 2012-09-03 ENCOUNTER — Other Ambulatory Visit: Payer: Self-pay | Admitting: *Deleted

## 2012-09-03 DIAGNOSIS — I6529 Occlusion and stenosis of unspecified carotid artery: Secondary | ICD-10-CM

## 2012-09-13 ENCOUNTER — Other Ambulatory Visit: Payer: Self-pay

## 2013-02-09 IMAGING — CT CT HEAD W/O CM
1 series · 16 of 30 positions shown, 20 images · non-contrast
Comparison: None.

CLINICAL DATA: Code stroke, slurred speech, blurred vision, syncope

CT HEAD WITHOUT CONTRAST
TECHNIQUE: Contiguous axial images were obtained from the base of
the skull through the vertex without contrast.

[Series 2: head routine 4.8 h37s · axial · 0.43mm/px · z∈[-126,+5]mm · 16 of 30 slices shown, 20 images]
[im 2/30  brain]
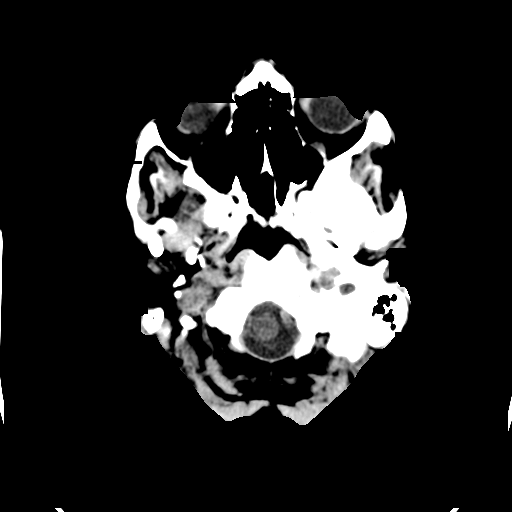
[im 2/30  bone]
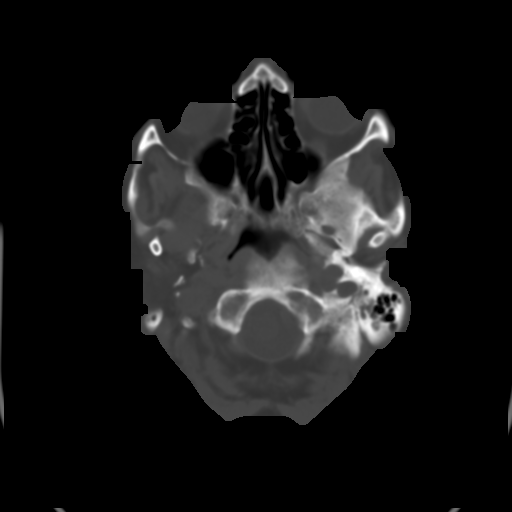
[im 4/30  brain]
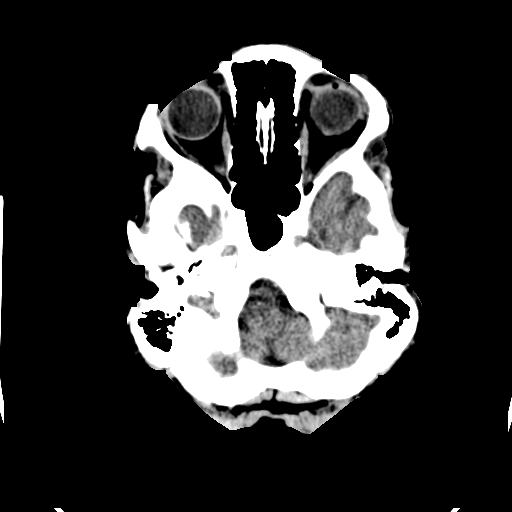
[im 6/30  brain]
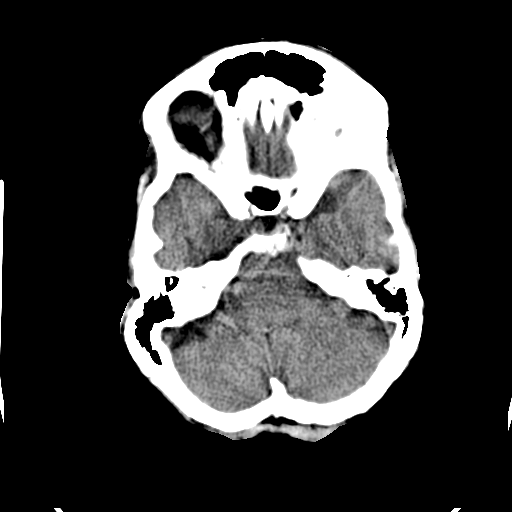
[im 8/30  brain]
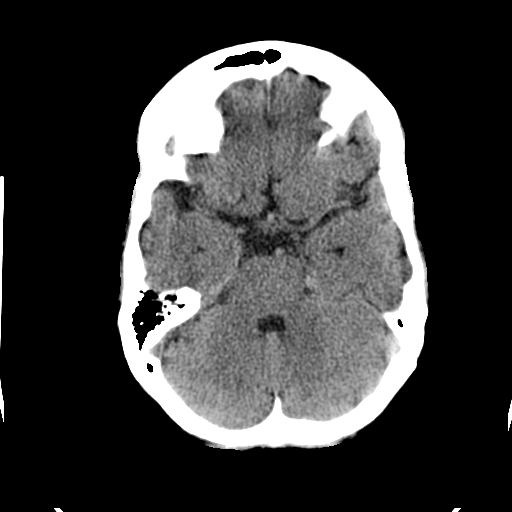
[im 9/30  brain]
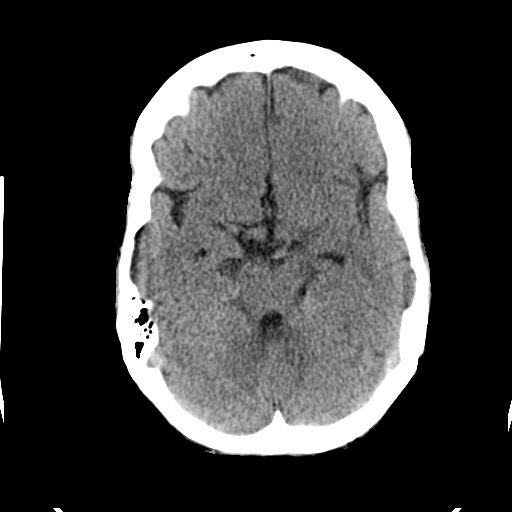
[im 9/30  bone]
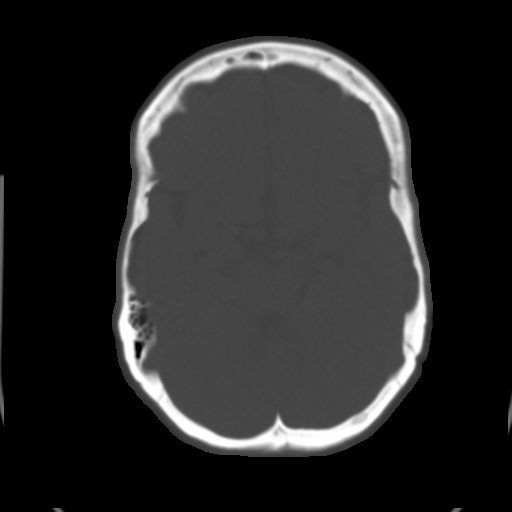
[im 11/30  brain]
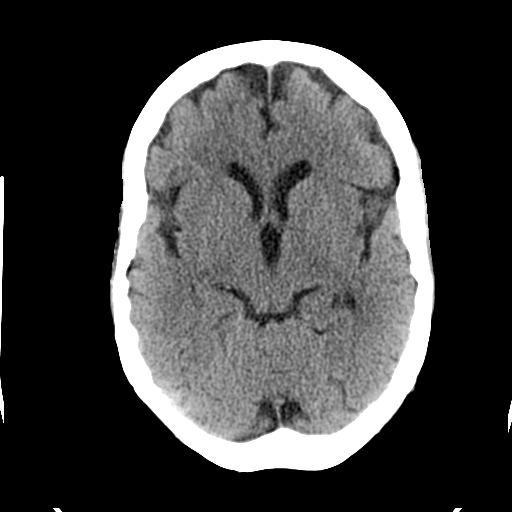
[im 13/30  brain]
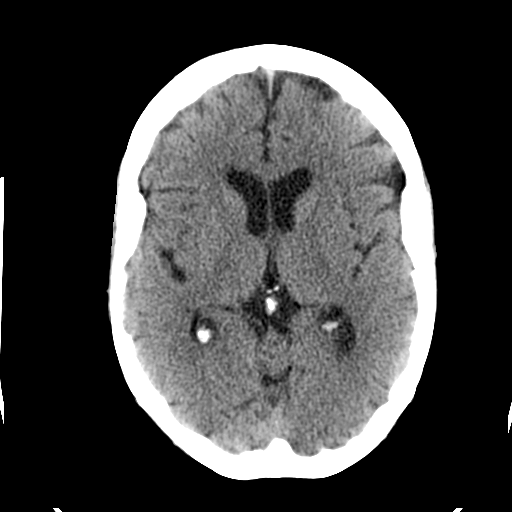
[im 15/30  brain]
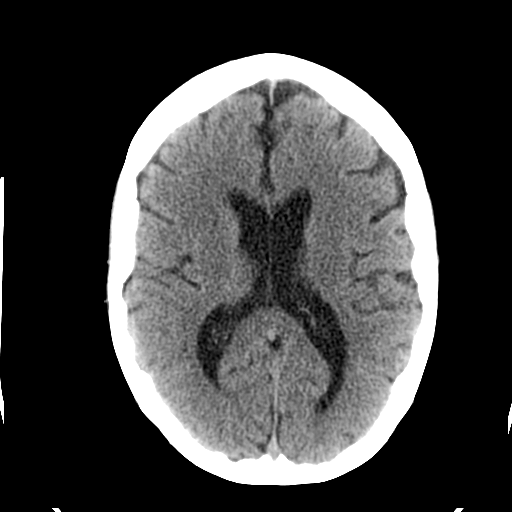
[im 16/30  brain]
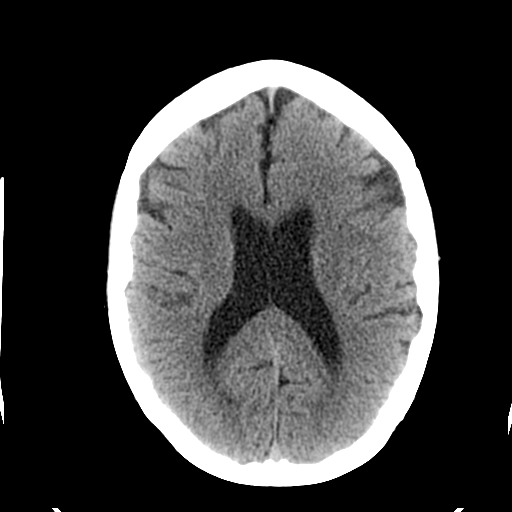
[im 16/30  bone]
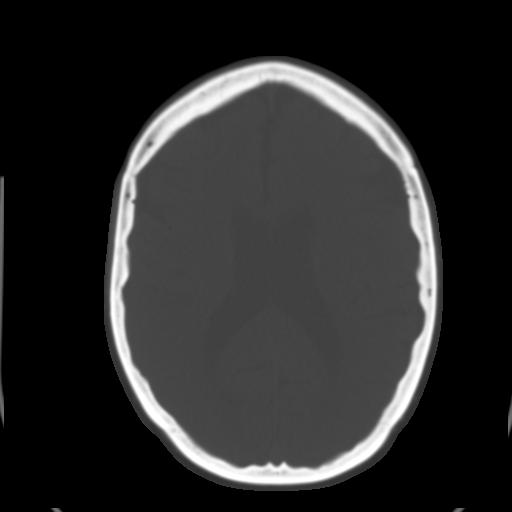
[im 18/30  brain]
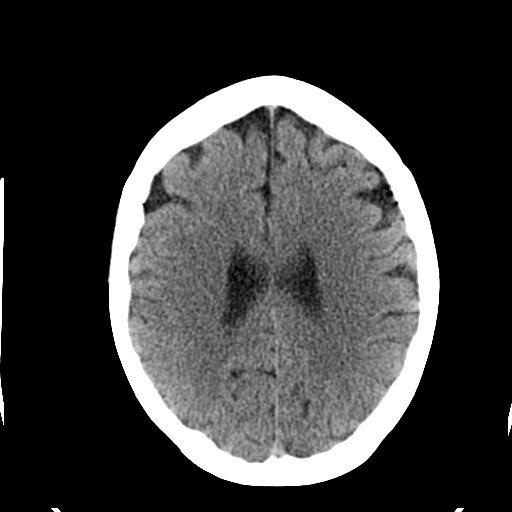
[im 20/30  brain]
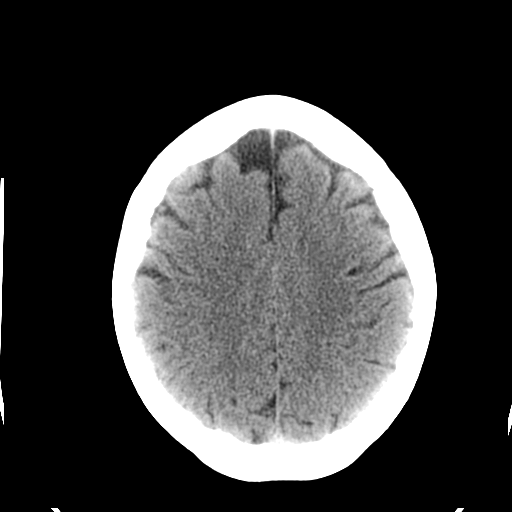
[im 22/30  brain]
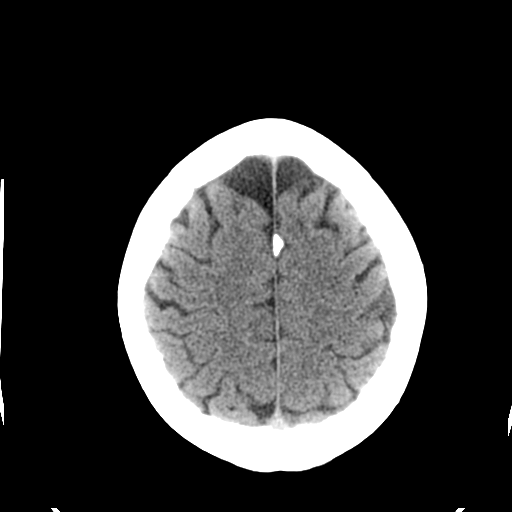
[im 23/30  brain]
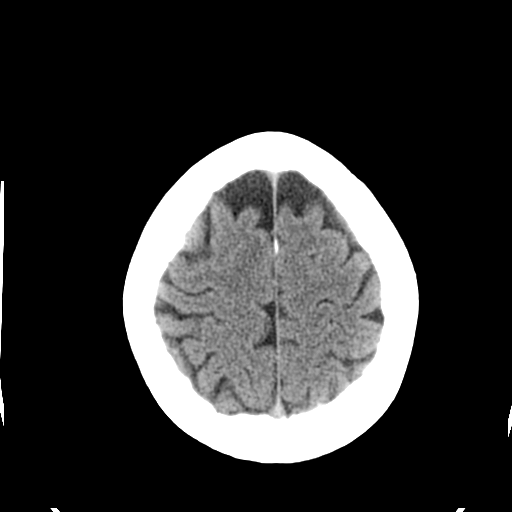
[im 23/30  bone]
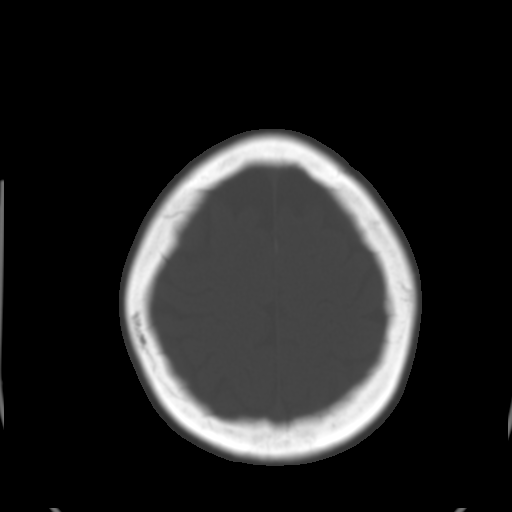
[im 25/30  brain]
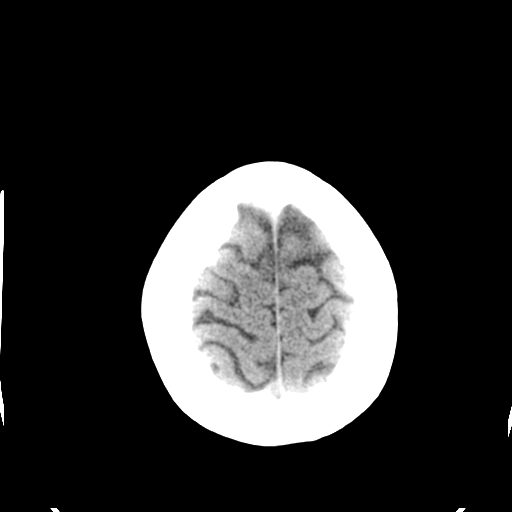
[im 27/30  brain]
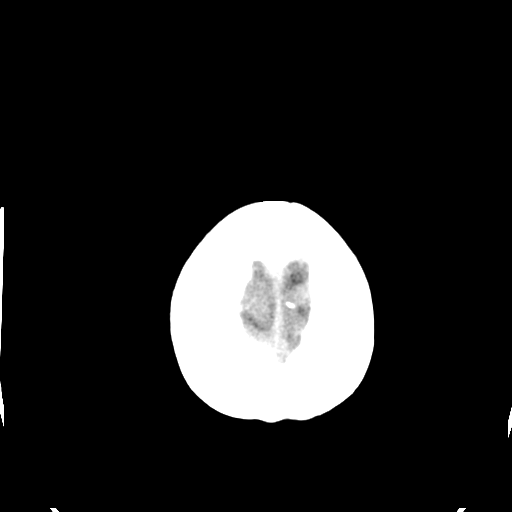
[im 29/30  brain]
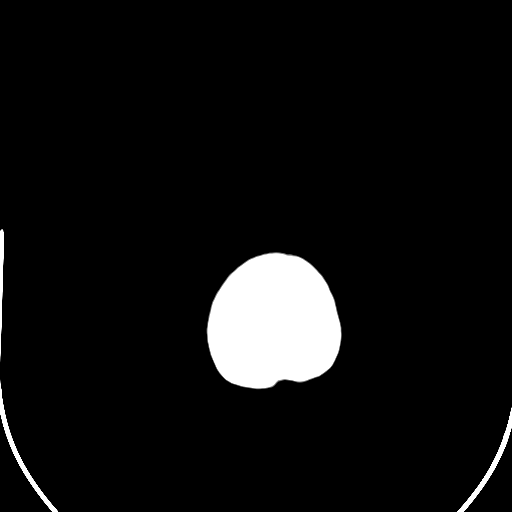

[16 of 30 positions shown; findings below may reference images not displayed]

FINDINGS: Diffuse mild cortical volume loss noted with proportional
ventricular prominence. No acute hemorrhage, acute infarction, or
mass lesion is identified.  No midline shift.  No ventriculomegaly.
No skull fracture.  No midline shift.  Orbits and paranasal sinuses
are intact.
IMPRESSION: No acute intracranial finding. These results were called by
telephone on 05/16/2012 at [DATE] p.m. to Dr. Amazigh, who verbally
acknowledged these results.

## 2013-02-12 IMAGING — CR DG CHEST 2V
2 series · 2 of 2 positions shown · non-contrast
Comparison: None.

CLINICAL DATA: Low sodium.  Syncope 3 days ago.  History
hypertension, lupus, Crohn's.

CHEST - 2 VIEW

[w chest pa]
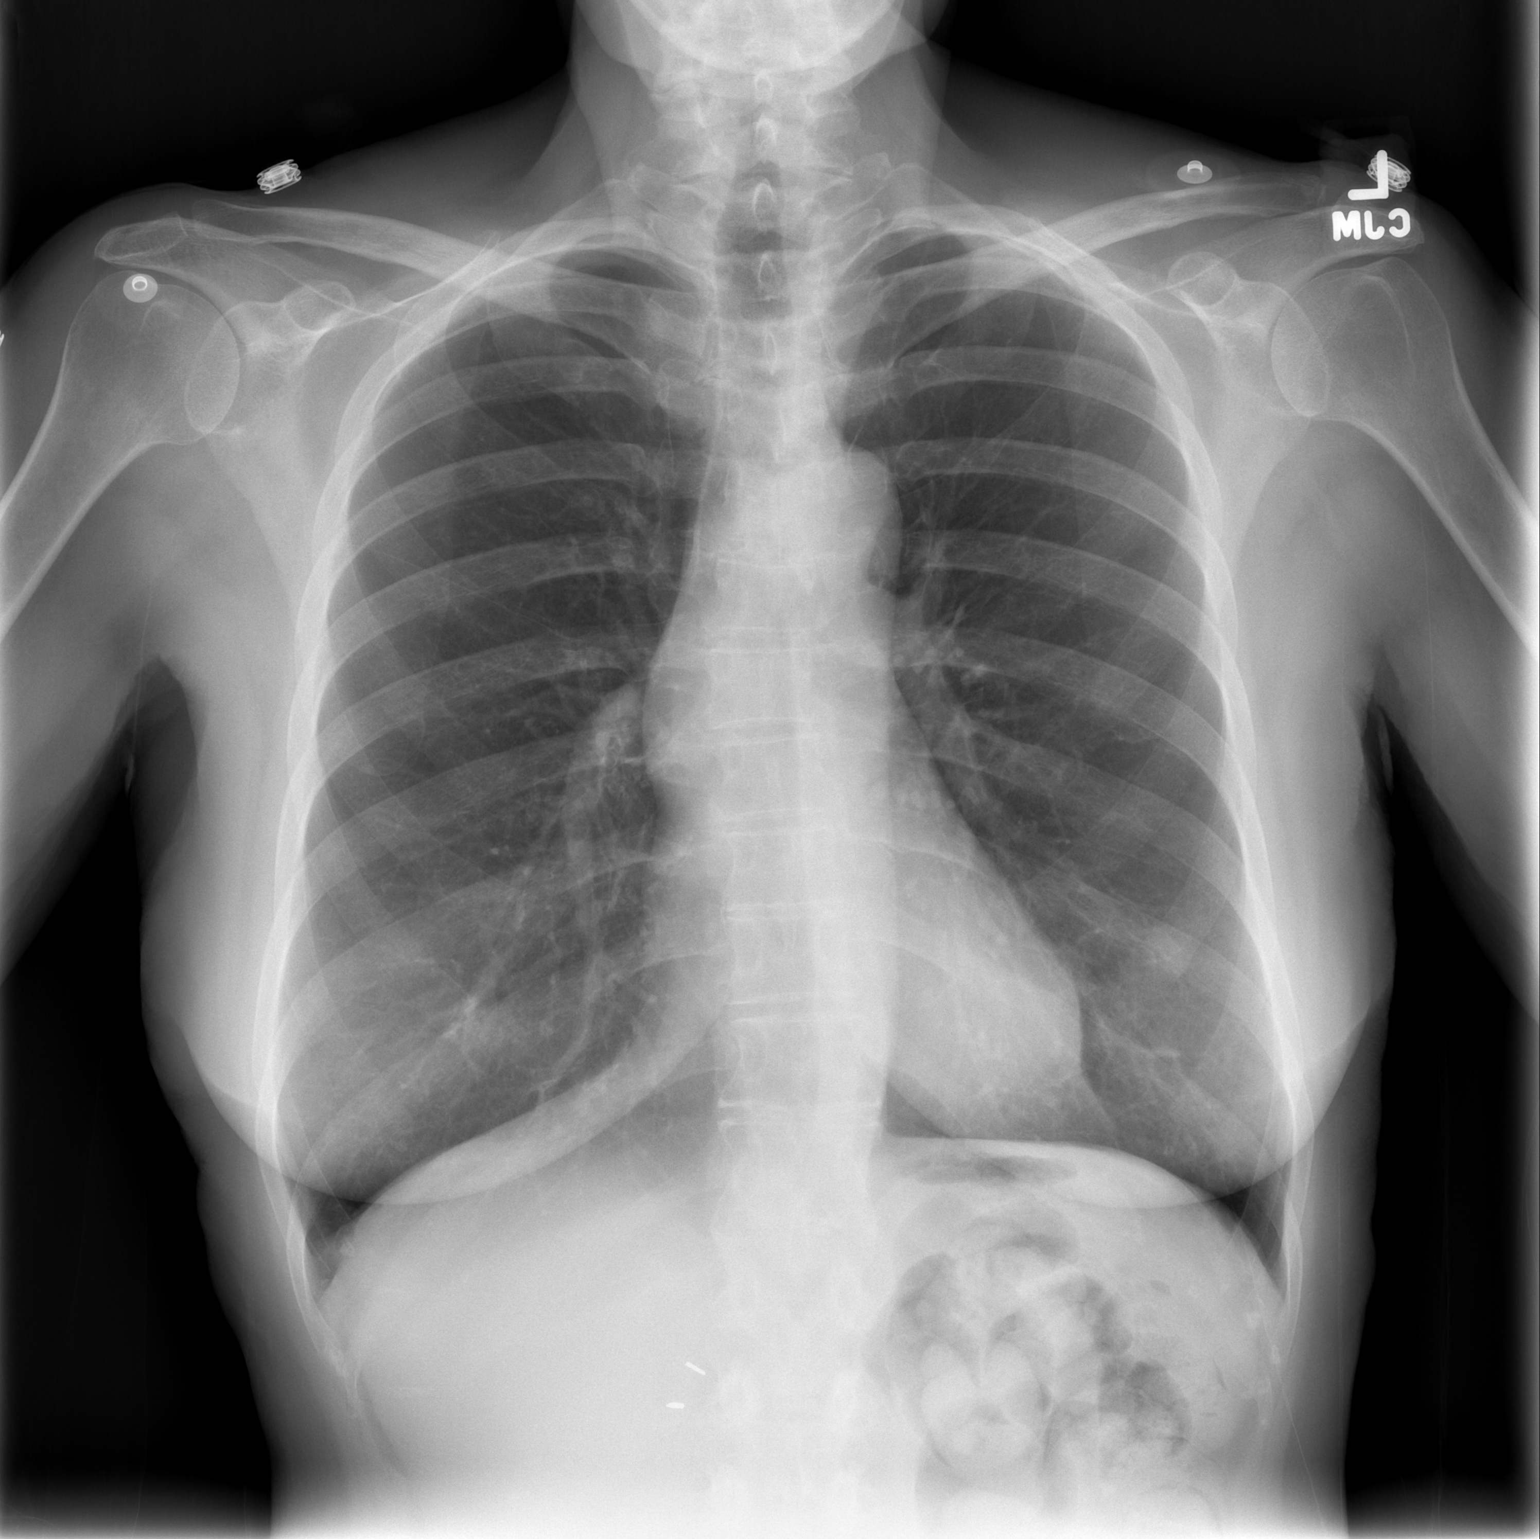

[w chest lat]
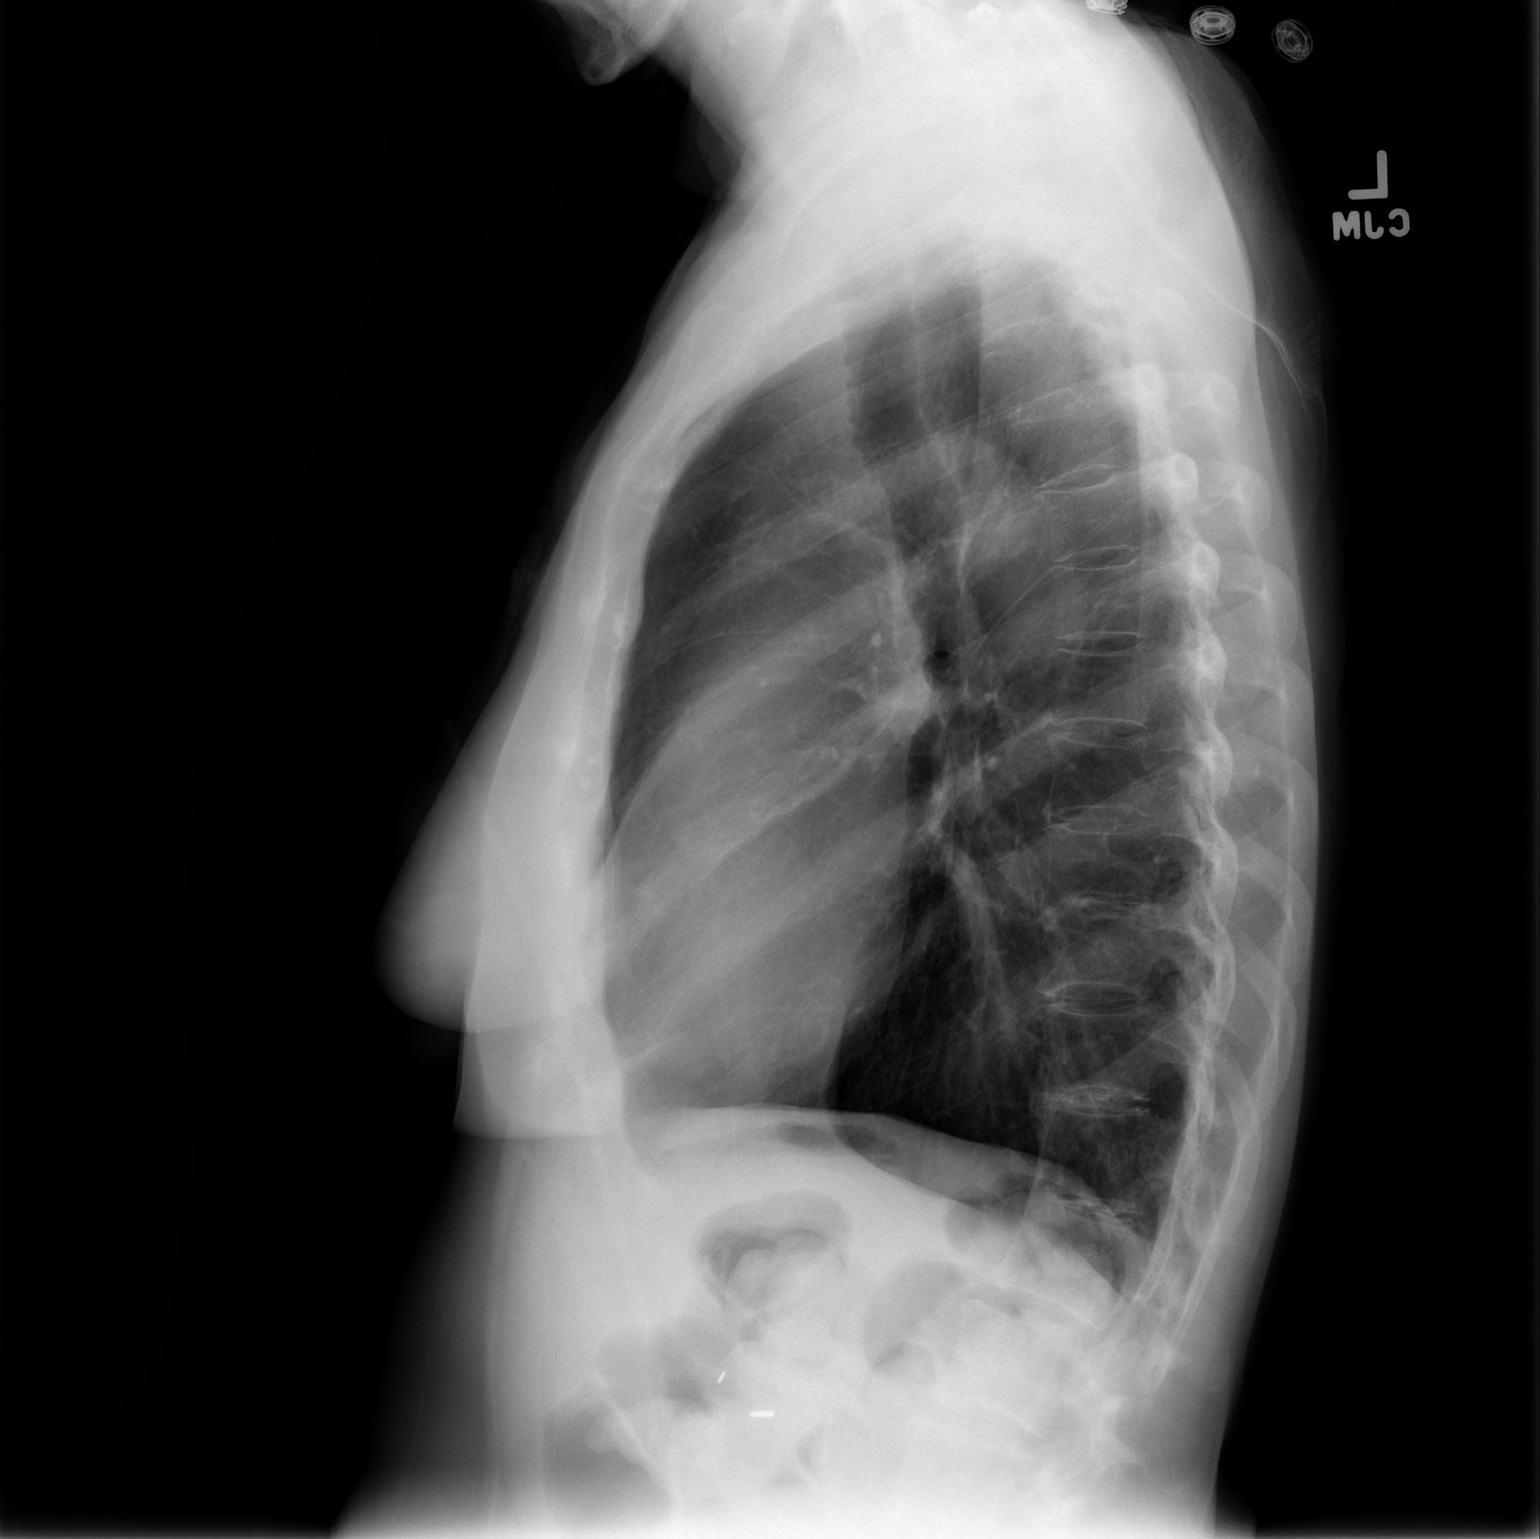

[2 of 2 positions shown; findings below may reference images not displayed]

FINDINGS: Cardiomediastinal silhouette is within normal limits.
The lungs are free of focal consolidations and pleural effusions.
No edema.  Mild degenerative changes are seen in the spine.
Surgical clips are present in the upper abdomen.
IMPRESSION: No evidence for acute cardiopulmonary abnormality.

## 2013-03-04 ENCOUNTER — Other Ambulatory Visit: Payer: Self-pay

## 2013-06-04 ENCOUNTER — Other Ambulatory Visit: Payer: Self-pay

## 2013-09-04 ENCOUNTER — Encounter: Payer: Self-pay | Admitting: Family

## 2013-09-07 ENCOUNTER — Ambulatory Visit (HOSPITAL_COMMUNITY)
Admission: RE | Admit: 2013-09-07 | Discharge: 2013-09-07 | Disposition: A | Discharge status: Home / Self Care | Payer: Medicare Other | Source: Ambulatory Visit | Attending: Family | Admitting: Family

## 2013-09-07 ENCOUNTER — Ambulatory Visit: Payer: Medicare Other | Admitting: Family

## 2013-09-07 DIAGNOSIS — I658 Occlusion and stenosis of other precerebral arteries: Secondary | ICD-10-CM | POA: Insufficient documentation

## 2013-09-07 DIAGNOSIS — I6529 Occlusion and stenosis of unspecified carotid artery: Secondary | ICD-10-CM | POA: Insufficient documentation

## 2013-09-07 NOTE — Patient Instructions (Addendum)
Stroke Prevention Some medical conditions and behaviors are associated with an increased chance of having a stroke. You may prevent a stroke by making healthy choices and managing medical conditions. HOW CAN I REDUCE MY RISK OF HAVING A STROKE?   Stay physically active. Get at least 30 minutes of activity on most or all days.  Do not smoke. It may also be helpful to avoid exposure to secondhand smoke.  Limit alcohol use. Moderate alcohol use is considered to be:  No more than 2 drinks per day for men.  No more than 1 drink per day for nonpregnant women.  Eat healthy foods. This involves  Eating 5 or more servings of fruits and vegetables a day.  Following a diet that addresses high blood pressure (hypertension), high cholesterol, diabetes, or obesity.  Manage your cholesterol levels.  A diet low in saturated fat, trans fat, and cholesterol and high in fiber may control cholesterol levels.  Take any prescribed medicines to control cholesterol as directed by your health care provider.  Manage your diabetes.  A controlled-carbohydrate, controlled-sugar diet is recommended to manage diabetes.  Take any prescribed medicines to control diabetes as directed by your health care provider.  Control your hypertension.  A low-salt (sodium), low-saturated fat, low-trans fat, and low-cholesterol diet is recommended to manage hypertension.  Take any prescribed medicines to control hypertension as directed by your health care provider.  Maintain a healthy weight.  A reduced-calorie, low-sodium, low-saturated fat, low-trans fat, low-cholesterol diet is recommended to manage weight.  Stop drug abuse.  Avoid taking birth control pills.  Talk to your health care provider about the risks of taking birth control pills if you are over 416 years old, smoke, get migraines, or have ever had a blood clot.  Get evaluated for sleep disorders (sleep apnea).  Talk to your health care provider about  getting a sleep evaluation if you snore a lot or have excessive sleepiness.  Take medicines as directed by your health care provider.  For some people, aspirin or blood thinners (anticoagulants) are helpful in reducing the risk of forming abnormal blood clots that can lead to stroke. If you have the irregular heart rhythm of atrial fibrillation, you should be on a blood thinner unless there is a good reason you cannot take them.  Understand all your medicine instructions.  Make sure that other other conditions (such as anemia or atherosclerosis) are addressed. SEEK IMMEDIATE MEDICAL CARE IF:   You have sudden weakness or numbness of the face, arm, or leg, especially on one side of the body.  Your face or eyelid droops to one side.  You have sudden confusion.  You have trouble speaking (aphasia) or understanding.  You have sudden trouble seeing in one or both eyes.  You have sudden trouble walking.  You have dizziness.  You have a loss of balance or coordination.  You have a sudden, severe headache with no known cause.  You have new chest pain or an irregular heartbeat. Any of these symptoms may represent a serious problem that is an emergency. Do not wait to see if the symptoms will go away. Get medical help at once. Call your local emergency services  (911 in [place] Do not drive yourself to the hospital. Document Released: 08/23/2004 Document Revised: 05/06/2013 Document Reviewed: 01/16/2013 [facility] Patient Information 2014 [place], [place]. Stroke Prevention Some medical conditions and behaviors are associated with an increased chance of having a stroke. You may prevent a stroke by making healthy choices and  managing medical conditions. HOW CAN I REDUCE MY RISK OF HAVING A STROKE?  Stay physically active. Get at least 30 minutes of activity on most or all days. Do not smoke. It may also be helpful to avoid exposure to secondhand smoke. Limit alcohol use. Moderate alcohol use  is considered to be: No more than 2 drinks per day for men. No more than 1 drink per day for nonpregnant women. Eat healthy foods. This involves Eating 5 or more servings of fruits and vegetables a day. Following a diet that addresses high blood pressure (hypertension), high cholesterol, diabetes, or obesity. Manage your cholesterol levels. A diet low in saturated fat, trans fat, and cholesterol and high in fiber may control cholesterol levels. Take any prescribed medicines to control cholesterol as directed by your health care provider. Manage your diabetes. A controlled-carbohydrate, controlled-sugar diet is recommended to manage diabetes. Take any prescribed medicines to control diabetes as directed by your health care provider. Control your hypertension. A low-salt (sodium), low-saturated fat, low-trans fat, and low-cholesterol diet is recommended to manage hypertension. Take any prescribed medicines to control hypertension as directed by your health care provider. Maintain a healthy weight. A reduced-calorie, low-sodium, low-saturated fat, low-trans fat, low-cholesterol diet is recommended to manage weight. Stop drug abuse. Avoid taking birth control pills. Talk to your health care provider about the risks of taking birth control pills if you are over 32 years old, smoke, get migraines, or have ever had a blood clot. Get evaluated for sleep disorders (sleep apnea). Talk to your health care provider about getting a sleep evaluation if you snore a lot or have excessive sleepiness. Take medicines as directed by your health care provider. For some people, aspirin or blood thinners (anticoagulants) are helpful in reducing the risk of forming abnormal blood clots that can lead to stroke. If you have the irregular heart rhythm of atrial fibrillation, you should be on a blood thinner unless there is a good reason you cannot take them. Understand all your medicine instructions. Make sure that  other other conditions (such as anemia or atherosclerosis) are addressed. SEEK IMMEDIATE MEDICAL CARE IF:  You have sudden weakness or numbness of the face, arm, or leg, especially on one side of the body. Your face or eyelid droops to one side. You have sudden confusion. You have trouble speaking (aphasia) or understanding. You have sudden trouble seeing in one or both eyes. You have sudden trouble walking. You have dizziness. You have a loss of balance or coordination. You have a sudden, severe headache with no known cause. You have new chest pain or an irregular heartbeat. Any of these symptoms may represent a serious problem that is an emergency. Do not wait to see if the symptoms will go away. Get medical help at once. Call your local emergency services  (911 in [place] Do not drive yourself to the hospital. Document Released: 08/23/2004 Document Revised: 05/06/2013 Document Reviewed: 01/16/2013 [facility] Patient Information 2014 [place], [place].

## 2013-09-07 NOTE — Progress Notes (Signed)
Lab only 

## 2013-09-08 ENCOUNTER — Other Ambulatory Visit: Payer: Medicare Other

## 2013-09-08 ENCOUNTER — Ambulatory Visit: Payer: Medicare Other | Admitting: Neurosurgery

## 2013-09-09 NOTE — Addendum Note (Signed)
Addended by: Adria DillELDRIDGE-LEWIS, Abenezer Odonell L on: 09/09/2013 04:34 PM   Modules accepted: Orders

## 2013-09-14 ENCOUNTER — Encounter: Payer: Self-pay | Admitting: Surgery

## 2014-09-09 ENCOUNTER — Ambulatory Visit: Payer: Medicare Other | Admitting: Family

## 2014-09-09 ENCOUNTER — Other Ambulatory Visit (HOSPITAL_COMMUNITY): Payer: Medicare Other

## 2014-09-10 ENCOUNTER — Encounter: Payer: Self-pay | Admitting: Family

## 2014-09-14 ENCOUNTER — Ambulatory Visit (HOSPITAL_COMMUNITY)
Admission: RE | Admit: 2014-09-14 | Discharge: 2014-09-14 | Disposition: A | Discharge status: Home / Self Care | Payer: Medicare Other | Source: Ambulatory Visit | Attending: Family | Admitting: Family

## 2014-09-14 ENCOUNTER — Encounter: Payer: Self-pay | Admitting: Family

## 2014-09-14 ENCOUNTER — Ambulatory Visit (INDEPENDENT_AMBULATORY_CARE_PROVIDER_SITE_OTHER): Payer: Medicare Other | Admitting: Family

## 2014-09-14 VITALS — BP 149/82 | HR 47 | Resp 16 | Ht 63.5 in | Wt 121.0 lb

## 2014-09-14 DIAGNOSIS — I6523 Occlusion and stenosis of bilateral carotid arteries: Secondary | ICD-10-CM

## 2014-09-14 DIAGNOSIS — Z87891 Personal history of nicotine dependence: Secondary | ICD-10-CM

## 2014-09-14 NOTE — Progress Notes (Signed)
Established Carotid Patient   History of Present Illness  Alyssa Caldwell is a 74 y.o. female patient of Dr. 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 Arbie CookeyEarly last saw pt in February 2014. She had an outlying ultrasound suggesting 60-79% stenosis of her left internal carotid artery. This was at the lower end of the scale. Since that time she has had no new focal neurologic deficits. She did trip and fall having injury to her right shoulder which is being treated with physical therapy. She also had a reaction to drugs for lupus causing hyponatremia and had syncope related to this. This has improved as well. She specifically denies any focal hemispheric weakness and aphasia or new visual problems   Patient has not had previous carotid artery intervention.  The patient denies any history of TIA or stroke symptoms, specifically the patient denies a history of amaurosis fugax or monocular blindness, denies a history unilateral  of facial drooping, denies a history of hemiplegia, and denies a history of receptive or expressive aphasi.    Pt reports that she has Lupus and Chron's Disease, receiving tx for these conditions.  Since January 2016 she has episodes of of tachycardia, as fast as 108, at rest, not when walking; states she has discussed this with her PCP.  The patient reports New Medical or Surgical History: repeat esophogeal dilation January 2016.   Pt Diabetic: No Pt smoker: former smoker, quit in the 1990's  Pt meds include: Statin : Yes ASA: No, due to easy bruising Other anticoagulants/antiplatelets: no   Past Medical History  Diagnosis Date  . Anemia   . Dermatitis   . Vertigo   . Spontaneous ecchymoses   . Carotid artery occlusion   . Hyperlipidemia   . Diverticulitis   . Fibromyalgia   . Enteritis   . Pulmonary hypertension   . Osteopenia   . Raynaud's disease   . GERD (gastroesophageal reflux disease)   .  Hypertension   . Mitral valve disorders   . Enzymopathy   . Systemic lupus erythematosus   . Hyperlipidemia   . Crohn's disease   . Raynaud's disease   . Shoulder fracture, right 07-2012  . Low sodium levels   . Hiatal hernia   . Atrial fibrillation     Social History History  Substance Use Topics  . Smoking status: Former Smoker    Types: Cigarettes    Quit date: 04/25/2002  . Smokeless tobacco: Never Used  . Alcohol Use: Yes    Family History Family History  Problem Relation Age of Onset  . Stroke Father   . Heart disease Father     Surgical History Past Surgical History  Procedure Laterality Date  . Abdominal hysterectomy    . Appendectomy    . Cholecystectomy    . Partial colectomy    . Eye surgery      Allergies  Allergen Reactions  . Codeine Diarrhea and Nausea And Vomiting  . Dapsone Other (See Comments)    Anemia and  Turned Blood black  . Lyrica [Pregabalin] Other (See Comments)    Bleeding, bruising, blisters and aching back  . Methotrexate Derivatives Anaphylaxis and Nausea And Vomiting    No appetite, passes out  . Plaquenil [Hydroxychloroquine] Other (See Comments)    Vision problems  . Tape Other (See Comments)    Very sensitive to most tapes, pulls skin off  and causes bleeding.    Current Outpatient Prescriptions  Medication Sig  Dispense Refill  . atorvastatin (LIPITOR) 20 MG tablet Take 20 mg by mouth daily.    . beta carotene w/minerals (OCUVITE) tablet Take 1 tablet by mouth daily.    . calcium carbonate (TUMS - DOSED IN MG ELEMENTAL CALCIUM) 500 MG chewable tablet Chew 3,000 mg by mouth daily.    . Cholecalciferol (VITAMIN D3) 5000 UNITS CAPS Take 2,500 Units by mouth daily.     . Clobetasol Propionate (CLOBEX SPRAY EX) Apply topically as needed.    . Cyanocobalamin (VITAMIN B-12 PO) Take 5,000 mg by mouth daily.     . cyclobenzaprine (FLEXERIL) 10 MG tablet Take 10 mg by mouth 3 (three) times daily as needed. For muscle spasms    .  denosumab (PROLIA) 60 MG/ML SOLN injection Inject 60 mg into the skin every 6 (six) months. Administer in upper arm, thigh, or abdomen    . feeding supplement (ENSURE COMPLETE) LIQD Take 237 mLs by mouth daily after lunch.    . fluticasone (FLONASE) 50 MCG/ACT nasal spray Place 2 sprays into the nose daily as needed. For allergies    . ibandronate (BONIVA) 150 MG tablet Take 150 mg by mouth every 30 (thirty) days. Take in the morning with a full glass of water, on an empty stomach, and do not take anything else by mouth or lie down for the next 30 min.    Marland Kitchen ibuprofen (ADVIL,MOTRIN) 400 MG tablet Take 400 mg by mouth every 6 (six) hours as needed.    . inFLIXimab (REMICADE) 100 MG injection Inject 100 mg into the vein. Every eight  weeks  IV    . metoprolol succinate (TOPROL-XL) 25 MG 24 hr tablet Take 50 mg by mouth daily.     Marland Kitchen omeprazole (PRILOSEC) 40 MG capsule Take 40 mg by mouth 2 (two) times daily.     . Probiotic Product (ALIGN PO) Take 1 capsule by mouth daily.     Marland Kitchen zolpidem (AMBIEN) 5 MG tablet Take 5 mg by mouth at bedtime as needed. For sleep    . ondansetron (ZOFRAN) 4 MG tablet Take 4 mg by mouth 4 (four) times daily as needed. For nausea    . predniSONE (DELTASONE) 10 MG tablet Take 10 mg by mouth 4 (four) times daily.     . ranitidine (ZANTAC) 150 MG tablet Take 150 mg by mouth 2 (two) times daily.     No current facility-administered medications for this visit.    Review of Systems : See HPI for pertinent positives and negatives.  Physical Examination  Filed Vitals:   09/14/14 1240 09/14/14 1241  BP: 155/86 149/82  Pulse: 49 47  Resp:  16  Height:  5' 3.5" (1.613 m)  Weight:  121 lb (54.885 kg)  SpO2:  98%   Body mass index is 21.1 kg/(m^2).  General: WDWN female in NAD GAIT: normal Eyes: PERRLA Pulmonary:  Non-labored, CTAB, Negative  Rales, Negative rhonchi, & Negative wheezing.  Cardiac: regular Rhythm, no detected murmur.  VASCULAR EXAM Carotid Bruits  Right Left   Negative Negative    Aorta is not palpable. Radial pulses are 2+ palpable and equal.  LE Pulses Right Left       POPLITEAL  not palpable   not palpable       DORSALIS PEDIS      ANTERIOR TIBIAL faintly palpable  faintly palpable     Gastrointestinal: soft, nontender, BS WNL, no r/g,  negative palpated masses.  Musculoskeletal: Negative muscle atrophy/wasting. M/S 5/5 throughout, Extremities without ischemic changes.  Neurologic: A&O X 3; Appropriate Affect;  Speech is normal CN 2-12 intact, Pain and light touch intact in extremities, Motor exam as listed above.   Non-Invasive Vascular Imaging CAROTID DUPLEX 09/14/2014   CEREBROVASCULAR DUPLEX EVALUATION    INDICATION: Follow-up carotid disease     PREVIOUS INTERVENTION(S):     DUPLEX EXAM:     RIGHT  LEFT  Peak Systolic Velocities (cm/s) End Diastolic Velocities (cm/s) Plaque LOCATION Peak Systolic Velocities (cm/s) End Diastolic Velocities (cm/s) Plaque  76 16  CCA PROXIMAL 99 28   78 22  CCA MID 92 29   86 24  CCA DISTAL 80 28   80 15  ECA 180 30   60 17  ICA PROXIMAL 162 49 HT  105 35  ICA MID 114 31   75 28  ICA DISTAL 92 31     .7 ICA / CCA Ratio (PSV) 2.0  Antegrade  Vertebral Flow Antegrade    Brachial Systolic Pressure (mmHg)   Within normal limits  Brachial Artery Waveforms Within normal limits     Plaque Morphology:  HM = Homogeneous, HT = Heterogeneous, CP = Calcific Plaque, SP = Smooth Plaque, IP = Irregular Plaque     ADDITIONAL FINDINGS:     IMPRESSION: 1. Evidence of minimal (<40%) stenosis of the right internal carotid artery. 2. Evidence of 40%-59% stenosis of the left internal carotid artery. 3. Bilateral vertebral artery is antegrade.    Compared to the previous exam:  No significant change compared to prior exam.       Assessment: Alyssa Caldwell is a  74 y.o. female who presents with asymptomatic minimal right ICA stenosis and 40%-59% stenosis of the left internal carotid artery. The  ICA stenosis is  Unchanged from previous exam.  Plan: Follow-up in 1 year with Carotid Duplex.   I discussed in depth with the patient the nature of atherosclerosis, and emphasized the importance of maximal medical management including strict control of blood pressure, blood glucose, and lipid levels, obtaining regular exercise, and continued cessation of smoking.  The patient is aware that without maximal medical management the underlying atherosclerotic disease process will progress, limiting the benefit of any interventions. The patient was given information about stroke prevention and what symptoms should prompt the patient to seek immediate medical care. Thank you for allowing [place] to participate in this patient's care.  Charisse March, RN, MSN, FNP-C [facility] Office: [phone]  Clinic Physician: Early  09/14/2014 12:46 PM

## 2014-09-14 NOTE — Patient Instructions (Signed)
Stroke Prevention Some medical conditions and behaviors are associated with an increased chance of having a stroke. You may prevent a stroke by making healthy choices and managing medical conditions. HOW CAN I REDUCE MY RISK OF HAVING A STROKE?   Stay physically active. Get at least 30 minutes of activity on most or all days.  Do not smoke. It may also be helpful to avoid exposure to secondhand smoke.  Limit alcohol use. Moderate alcohol use is considered to be:  No more than 2 drinks per day for men.  No more than 1 drink per day for nonpregnant women.  Eat healthy foods. This involves:  Eating 5 or more servings of fruits and vegetables a day.  Making dietary changes that address high blood pressure (hypertension), high cholesterol, diabetes, or obesity.  Manage your cholesterol levels.  Making food choices that are high in fiber and low in saturated fat, trans fat, and cholesterol may control cholesterol levels.  Take any prescribed medicines to control cholesterol as directed by your health care provider.  Manage your diabetes.  Controlling your carbohydrate and sugar intake is recommended to manage diabetes.  Take any prescribed medicines to control diabetes as directed by your health care provider.  Control your hypertension.  Making food choices that are low in salt (sodium), saturated fat, trans fat, and cholesterol is recommended to manage hypertension.  Take any prescribed medicines to control hypertension as directed by your health care provider.  Maintain a healthy weight.  Reducing calorie intake and making food choices that are low in sodium, saturated fat, trans fat, and cholesterol are recommended to manage weight.  Stop drug abuse.  Avoid taking birth control pills.  Talk to your health care provider about the risks of taking birth control pills if you are over 35 years old, smoke, get migraines, or have ever had a blood clot.  Get evaluated for sleep  disorders (sleep apnea).  Talk to your health care provider about getting a sleep evaluation if you snore a lot or have excessive sleepiness.  Take medicines only as directed by your health care provider.  For some people, aspirin or blood thinners (anticoagulants) are helpful in reducing the risk of forming abnormal blood clots that can lead to stroke. If you have the irregular heart rhythm of atrial fibrillation, you should be on a blood thinner unless there is a good reason you cannot take them.  Understand all your medicine instructions.  Make sure that other conditions (such as anemia or atherosclerosis) are addressed. SEEK IMMEDIATE MEDICAL CARE IF:   You have sudden weakness or numbness of the face, arm, or leg, especially on one side of the body.  Your face or eyelid droops to one side.  You have sudden confusion.  You have trouble speaking (aphasia) or understanding.  You have sudden trouble seeing in one or both eyes.  You have sudden trouble walking.  You have dizziness.  You have a loss of balance or coordination.  You have a sudden, severe headache with no known cause.  You have new chest pain or an irregular heartbeat. Any of these symptoms may represent a serious problem that is an emergency. Do not wait to see if the symptoms will go away. Get medical help at once. Call your local emergency services (911 in [place] Do not drive yourself to the hospital. Document Released: 08/23/2004 Document Revised: 11/30/2013 Document Reviewed: 01/16/2013 [employer] Patient Information 2015 [employer]. This information is not intended to replace advice given   to you by your health care provider. Make sure you discuss any questions you have with your health care provider.  

## 2014-09-15 NOTE — Addendum Note (Signed)
Addended by: Adria DillELDRIDGE-LEWIS, Tramya Schoenfelder L on: 09/15/2014 11:58 AM   Modules accepted: Orders

## 2015-09-13 ENCOUNTER — Encounter: Payer: Self-pay | Admitting: Family

## 2015-09-20 ENCOUNTER — Ambulatory Visit (INDEPENDENT_AMBULATORY_CARE_PROVIDER_SITE_OTHER): Payer: Medicare Other | Admitting: Family

## 2015-09-20 ENCOUNTER — Ambulatory Visit (HOSPITAL_COMMUNITY)
Admission: RE | Admit: 2015-09-20 | Discharge: 2015-09-20 | Disposition: A | Discharge status: Home / Self Care | Payer: Medicare Other | Source: Ambulatory Visit | Attending: Family | Admitting: Family

## 2015-09-20 ENCOUNTER — Encounter: Payer: Self-pay | Admitting: Family

## 2015-09-20 VITALS — BP 125/78 | HR 71 | Temp 96.9°F | Resp 16 | Ht 64.0 in | Wt 119.0 lb

## 2015-09-20 DIAGNOSIS — Z87891 Personal history of nicotine dependence: Secondary | ICD-10-CM

## 2015-09-20 DIAGNOSIS — I272 Other secondary pulmonary hypertension: Secondary | ICD-10-CM | POA: Diagnosis not present

## 2015-09-20 DIAGNOSIS — E785 Hyperlipidemia, unspecified: Secondary | ICD-10-CM | POA: Insufficient documentation

## 2015-09-20 DIAGNOSIS — I6523 Occlusion and stenosis of bilateral carotid arteries: Secondary | ICD-10-CM

## 2015-09-20 DIAGNOSIS — I10 Essential (primary) hypertension: Secondary | ICD-10-CM | POA: Insufficient documentation

## 2015-09-20 DIAGNOSIS — I6522 Occlusion and stenosis of left carotid artery: Secondary | ICD-10-CM | POA: Diagnosis not present

## 2015-09-20 NOTE — Progress Notes (Signed)
Chief Complaint: Extracranial Carotid Artery Stenosis   History of Present Illness  Alyssa Caldwell is a 75 y.o. female patient of Dr. 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 Arbie Cookey last saw pt in February 2014. She had an outlying ultrasound suggesting 60-79% stenosis of her left internal carotid artery. This was at the lower end of the scale. Since that time she has had no new focal neurologic deficits. She did trip and fall having injury to her right shoulder which was treated with physical therapy. She also had a reaction to drugs for lupus causing hyponatremia and had syncope related to this. This has improved as well. She specifically denies any focal hemispheric weakness and aphasia or new visual problems   Patient has not had previous carotid artery intervention.  The patient denies any history of TIA or stroke symptoms, specifically the patient denies a history of amaurosis fugax or monocular blindness, denies a history unilateral of facial drooping, denies a history of hemiplegia, and denies a history of receptive or expressive aphasia.   Pt reports that she has Lupus and Chron's Disease, receiving tx for these conditions.  Since January 2016 she has episodes of of tachycardia, as fast as 108, at rest, not when walking; states she has discussed this with her PCP.  The patient reports New Medical or Surgical History: repeat esophogeal dilation January 2016.  She had a CT chest w/o contrast on 01/13/15 at [facility] in [place], for further evaluation of ascending aortic aneurysm detected on cardiac cath at [facility] in [place], [place] on 01/05/15 which pt's records that she brought with her indicate a maximum aneurysmal dilatation of the ascending aorta at 6.3 cm. Pt is being followed for this by her cardiologist in [place], [place],Dr.  Helak.   Pt Diabetic: No Pt smoker: former smoker, quit in the  1990's  Pt meds include: Statin : Yes ASA: yes, 81 mg Other anticoagulants/antiplatelets: no    Past Medical History  Diagnosis Date  . Anemia   . Dermatitis   . Vertigo   . Spontaneous ecchymoses   . Carotid artery occlusion   . Hyperlipidemia   . Diverticulitis   . Fibromyalgia   . Enteritis   . Pulmonary hypertension (HCC)   . Osteopenia   . Raynaud's disease   . GERD (gastroesophageal reflux disease)   . Hypertension   . Mitral valve disorders   . Enzymopathy   . Systemic lupus erythematosus (HCC)   . Hyperlipidemia   . Crohn's disease (HCC)   . Raynaud's disease   . Shoulder fracture, right 07-2012  . Low sodium levels   . Hiatal hernia   . Atrial fibrillation [facility])     Social History Social History  Substance Use Topics  . Smoking status: Former Smoker    Types: Cigarettes    Quit date: 04/25/2002  . Smokeless tobacco: Never Used  . Alcohol Use: Yes    Family History Family History  Problem Relation Age of Onset  . Stroke Father   . Heart disease Father     Surgical History Past Surgical History  Procedure Laterality Date  . Abdominal hysterectomy    . Appendectomy    . Cholecystectomy    . Partial colectomy    . Eye surgery      Allergies  Allergen Reactions  . Codeine Diarrhea and Nausea And Vomiting  . Dapsone Other (See Comments)    Anemia and  Turned Blood black  .  Lyrica [Pregabalin] Other (See Comments)    Bleeding, bruising, blisters and aching back  . Methotrexate Derivatives Anaphylaxis and Nausea And Vomiting    No appetite, passes out  . Plaquenil [Hydroxychloroquine] Other (See Comments)    Vision problems  . Tape Other (See Comments)    Very sensitive to most tapes, pulls skin off  and causes bleeding.    Current Outpatient Prescriptions  Medication Sig Dispense Refill  . aspirin 81 MG tablet Take 81 mg by mouth daily.    Marland Kitchen atorvastatin (LIPITOR) 20 MG tablet Take 20 mg by mouth daily.    . calcium carbonate (TUMS -  DOSED IN MG ELEMENTAL CALCIUM) 500 MG chewable tablet Chew 3,000 mg by mouth daily.    . Cholecalciferol (VITAMIN D3) 5000 UNITS CAPS Take 2,500 Units by mouth daily.     . Clobetasol Propionate (CLOBEX SPRAY EX) Apply topically as needed.    . cloNIDine (CATAPRES) 0.1 MG tablet Take 0.1 mg by mouth as needed.    . Cyanocobalamin (VITAMIN B-12 PO) Take 5,000 mg by mouth daily.     Marland Kitchen denosumab (PROLIA) 60 MG/ML SOLN injection Inject 60 mg into the skin every 6 (six) months. Administer in upper arm, thigh, or abdomen    . fluticasone (FLONASE) 50 MCG/ACT nasal spray Place 2 sprays into the nose daily as needed. For allergies    . ibuprofen (ADVIL,MOTRIN) 400 MG tablet Take 400 mg by mouth every 6 (six) hours as needed.    . inFLIXimab (REMICADE) 100 MG injection Inject 100 mg into the vein. Every eight  weeks 100mg  IV    . loratadine (CLARITIN) 10 MG tablet Take 10 mg by mouth daily.    . metoprolol succinate (TOPROL-XL) 25 MG 24 hr tablet Take 50 mg by mouth daily.     . potassium chloride (K-DUR,KLOR-CON) 10 MEQ tablet daily.  1  . Probiotic Product (ALIGN PO) Take 1 capsule by mouth daily.     . ranitidine (ZANTAC) 150 MG tablet Take 150 mg by mouth 2 (two) times daily.    . sodium chloride 1 g tablet daily.  1  . terazosin (HYTRIN) 5 MG capsule daily.  1  . zolpidem (AMBIEN) 5 MG tablet Take 5 mg by mouth at bedtime as needed. For sleep    . beta carotene w/minerals (OCUVITE) tablet Take 1 tablet by mouth daily. Reported on 09/20/2015    . cyclobenzaprine (FLEXERIL) 10 MG tablet Take 10 mg by mouth 3 (three) times daily as needed. Reported on 09/20/2015    . feeding supplement (ENSURE COMPLETE) LIQD Take 237 mLs by mouth daily after lunch. (Patient not taking: Reported on 09/20/2015)    . ibandronate (BONIVA) 150 MG tablet Take 150 mg by mouth every 30 (thirty) days. Reported on 09/20/2015    . omeprazole (PRILOSEC) 40 MG capsule Take 40 mg by mouth 2 (two) times daily. Reported on 09/20/2015    .  ondansetron (ZOFRAN) 4 MG tablet Take 4 mg by mouth 4 (four) times daily as needed. Reported on 09/20/2015    . predniSONE (DELTASONE) 10 MG tablet Take 10 mg by mouth 4 (four) times daily. Reported on 09/20/2015     No current facility-administered medications for this visit.    Review of Systems : See HPI for pertinent positives and negatives.  Physical Examination  Filed Vitals:   09/20/15 1351 09/20/15 1354 09/20/15 1406 09/20/15 1409  BP: 141/91 140/89 128/76 125/78  Pulse: 97 98 75 71  Temp:  96.9 F (36.1 C)    TempSrc:  Oral    Resp:  16    Height:   (1.626 m)    Weight:  119 lb (53.978 kg)    SpO2:  100%     Body mass index is 20.42 kg/(m^2).  General: WDWN female in NAD GAIT: normal Eyes: PERRLA Pulmonary: Non-labored respirations, CTAB   Cardiac: regular rhythm, no detected murmur.  VASCULAR EXAM Carotid Bruits Right Left   Negative Negative   Aorta is not palpable. Radial pulses are 2+ palpable and equal.      LE Pulses Right Left       FEMORAL 2+ palpable 2+ palpable   POPLITEAL not palpable  not palpable   DORSALIS PEDIS  ANTERIOR TIBIAL  palpable   palpable        PT palpable palpable    Gastrointestinal: soft, nontender, BS WNL, no r/g, no palpated masses.  Musculoskeletal: Negative muscle atrophy/wasting. M/S 5/5 throughout, Extremities without ischemic changes.  Neurologic: A&O X 3; Appropriate Affect;  Speech is normal CN 2-12 intact, Pain and light touch intact in extremities, Motor exam as listed above.         Non-Invasive Vascular Imaging CAROTID DUPLEX 09/20/2015   Right ICA: no stenosis. Left ICA: 40 - 59 % stenosis. No significant change compared to exam of 09/14/14.   8 pages of outside facility documentation viewed: CT chest w/o contrast performed 01/13/15;  cardiac cath performed 01/05/15   Assessment: Alyssa Caldwell is a 75 y.o. female who has no history of stroke or TIA. Today's carotid duplex suggests no stenosis of the right internal carotid artery and 40-59% stenosis of the left ICA. No significant change compared to exam of 09/14/14.   Plan: Follow-up in 1 year with Carotid Duplex scan.   I discussed in depth with the patient the nature of atherosclerosis, and emphasized the importance of maximal medical management including strict control of blood pressure, blood glucose, and lipid levels, obtaining regular exercise, and continued cessation of smoking.  The patient is aware that without maximal medical management the underlying atherosclerotic disease process will progress, limiting the benefit of any interventions. The patient was given information about stroke prevention and what symptoms should prompt the patient to seek immediate medical care. Thank you for allowing [place] to participate in this patient's care.  Charisse March, RN, MSN, FNP-C [facility] Office: [phone]  Clinic Physician: Early  09/20/2015 2:35 PM

## 2015-09-20 NOTE — Patient Instructions (Signed)
Stroke Prevention Some medical conditions and behaviors are associated with an increased chance of having a stroke. You may prevent a stroke by making healthy choices and managing medical conditions. HOW CAN I REDUCE MY RISK OF HAVING A STROKE?   Stay physically active. Get at least 30 minutes of activity on most or all days.  Do not smoke. It may also be helpful to avoid exposure to secondhand smoke.  Limit alcohol use. Moderate alcohol use is considered to be:  No more than 2 drinks per day for men.  No more than 1 drink per day for nonpregnant women.  Eat healthy foods. This involves:  Eating 5 or more servings of fruits and vegetables a day.  Making dietary changes that address high blood pressure (hypertension), high cholesterol, diabetes, or obesity.  Manage your cholesterol levels.  Making food choices that are high in fiber and low in saturated fat, trans fat, and cholesterol may control cholesterol levels.  Take any prescribed medicines to control cholesterol as directed by your health care provider.  Manage your diabetes.  Controlling your carbohydrate and sugar intake is recommended to manage diabetes.  Take any prescribed medicines to control diabetes as directed by your health care provider.  Control your hypertension.  Making food choices that are low in salt (sodium), saturated fat, trans fat, and cholesterol is recommended to manage hypertension.  Ask your health care provider if you need treatment to lower your blood pressure. Take any prescribed medicines to control hypertension as directed by your health care provider.  If you are 18-39 years of age, have your blood pressure checked every 3-5 years. If you are 40 years of age or older, have your blood pressure checked every year.  Maintain a healthy weight.  Reducing calorie intake and making food choices that are low in sodium, saturated fat, trans fat, and cholesterol are recommended to manage  weight.  Stop drug abuse.  Avoid taking birth control pills.  Talk to your health care provider about the risks of taking birth control pills if you are over 35 years old, smoke, get migraines, or have ever had a blood clot.  Get evaluated for sleep disorders (sleep apnea).  Talk to your health care provider about getting a sleep evaluation if you snore a lot or have excessive sleepiness.  Take medicines only as directed by your health care provider.  For some people, aspirin or blood thinners (anticoagulants) are helpful in reducing the risk of forming abnormal blood clots that can lead to stroke. If you have the irregular heart rhythm of atrial fibrillation, you should be on a blood thinner unless there is a good reason you cannot take them.  Understand all your medicine instructions.  Make sure that other conditions (such as anemia or atherosclerosis) are addressed. SEEK IMMEDIATE MEDICAL CARE IF:   You have sudden weakness or numbness of the face, arm, or leg, especially on one side of the body.  Your face or eyelid droops to one side.  You have sudden confusion.  You have trouble speaking (aphasia) or understanding.  You have sudden trouble seeing in one or both eyes.  You have sudden trouble walking.  You have dizziness.  You have a loss of balance or coordination.  You have a sudden, severe headache with no known cause.  You have new chest pain or an irregular heartbeat. Any of these symptoms may represent a serious problem that is an emergency. Do not wait to see if the symptoms will   go away. Get medical help at once. Call your local emergency services (911 in [place] Do not drive yourself to the hospital.   This information is not intended to replace advice given to you by your health care provider. Make sure you discuss any questions you have with your health care provider.   Document Released: 08/23/2004 Document Revised: 08/06/2014 Document Reviewed:  01/16/2013 Elsevier Interactive Patient Education 2016 Elsevier Inc.  

## 2015-09-20 NOTE — Progress Notes (Signed)
Filed Vitals:   09/20/15 1351 09/20/15 1354 09/20/15 1406 09/20/15 1409  BP: 141/91 140/89 128/76 125/78  Pulse: 97 98 75 71  Temp:  96.9 F (36.1 C)    TempSrc:  Oral    Resp:  16    Height:   (1.626 m)    Weight:  119 lb (53.978 kg)    SpO2:  100%

## 2016-09-19 ENCOUNTER — Encounter: Payer: Self-pay | Admitting: Family

## 2016-09-25 ENCOUNTER — Encounter: Payer: Self-pay | Admitting: Family

## 2016-09-25 ENCOUNTER — Ambulatory Visit (HOSPITAL_COMMUNITY)
Admission: RE | Admit: 2016-09-25 | Discharge: 2016-09-25 | Disposition: A | Discharge status: Home / Self Care | Payer: Medicare Other | Source: Ambulatory Visit | Attending: Family | Admitting: Family

## 2016-09-25 ENCOUNTER — Ambulatory Visit (INDEPENDENT_AMBULATORY_CARE_PROVIDER_SITE_OTHER): Payer: Medicare Other | Admitting: Family

## 2016-09-25 VITALS — BP 120/74 | HR 57 | Temp 97.3°F | Resp 18 | Ht 64.0 in | Wt 124.3 lb

## 2016-09-25 DIAGNOSIS — I6522 Occlusion and stenosis of left carotid artery: Secondary | ICD-10-CM

## 2016-09-25 DIAGNOSIS — Z87891 Personal history of nicotine dependence: Secondary | ICD-10-CM | POA: Diagnosis present

## 2016-09-25 LAB — VAS US CAROTID
LCCADSYS: -71 cm/s
LEFT ECA DIAS: -5 cm/s
LEFT VERTEBRAL DIAS: -15 cm/s
LICAPDIAS: 47 cm/s
Left CCA dist dias: -18 cm/s
Left CCA prox dias: 16 cm/s
Left CCA prox sys: 64 cm/s
Left ICA dist dias: -20 cm/s
Left ICA dist sys: -53 cm/s
Left ICA prox sys: 256 cm/s
RCCAPDIAS: 12 cm/s
RIGHT CCA MID DIAS: 14 cm/s
RIGHT ECA DIAS: -3 cm/s
RIGHT VERTEBRAL DIAS: 13 cm/s
Right CCA prox sys: 92 cm/s
Right cca dist sys: -63 cm/s

## 2016-09-25 NOTE — Patient Instructions (Signed)
Stroke Prevention Some medical conditions and behaviors are associated with an increased chance of having a stroke. You may prevent a stroke by making healthy choices and managing medical conditions. How can I reduce my risk of having a stroke?  Stay physically active. Get at least 30 minutes of activity on most or all days.  Do not smoke. It may also be helpful to avoid exposure to secondhand smoke.  Limit alcohol use. Moderate alcohol use is considered to be:  No more than 2 drinks per day for men.  No more than 1 drink per day for nonpregnant women.  Eat healthy foods. This involves:  Eating 5 or more servings of fruits and vegetables a day.  Making dietary changes that address high blood pressure (hypertension), high cholesterol, diabetes, or obesity.  Manage your cholesterol levels.  Making food choices that are high in fiber and low in saturated fat, trans fat, and cholesterol may control cholesterol levels.  Take any prescribed medicines to control cholesterol as directed by your health care provider.  Manage your diabetes.  Controlling your carbohydrate and sugar intake is recommended to manage diabetes.  Take any prescribed medicines to control diabetes as directed by your health care provider.  Control your hypertension.  Making food choices that are low in salt (sodium), saturated fat, trans fat, and cholesterol is recommended to manage hypertension.  Ask your health care provider if you need treatment to lower your blood pressure. Take any prescribed medicines to control hypertension as directed by your health care provider.  If you are 18-39 years of age, have your blood pressure checked every 3-5 years. If you are 40 years of age or older, have your blood pressure checked every year.  Maintain a healthy weight.  Reducing calorie intake and making food choices that are low in sodium, saturated fat, trans fat, and cholesterol are recommended to manage  weight.  Stop drug abuse.  Avoid taking birth control pills.  Talk to your health care provider about the risks of taking birth control pills if you are over 35 years old, smoke, get migraines, or have ever had a blood clot.  Get evaluated for sleep disorders (sleep apnea).  Talk to your health care provider about getting a sleep evaluation if you snore a lot or have excessive sleepiness.  Take medicines only as directed by your health care provider.  For some people, aspirin or blood thinners (anticoagulants) are helpful in reducing the risk of forming abnormal blood clots that can lead to stroke. If you have the irregular heart rhythm of atrial fibrillation, you should be on a blood thinner unless there is a good reason you cannot take them.  Understand all your medicine instructions.  Make sure that other conditions (such as anemia or atherosclerosis) are addressed. Get help right away if:  You have sudden weakness or numbness of the face, arm, or leg, especially on one side of the body.  Your face or eyelid droops to one side.  You have sudden confusion.  You have trouble speaking (aphasia) or understanding.  You have sudden trouble seeing in one or both eyes.  You have sudden trouble walking.  You have dizziness.  You have a loss of balance or coordination.  You have a sudden, severe headache with no known cause.  You have new chest pain or an irregular heartbeat. Any of these symptoms may represent a serious problem that is an emergency. Do not wait to see if the symptoms will go away.   Get medical help at once. Call your local emergency services (911 in [place] Do not drive yourself to the hospital. This information is not intended to replace advice given to you by your health care provider. Make sure you discuss any questions you have with your health care provider. Document Released: 08/23/2004 Document Revised: 12/22/2015 Document Reviewed: 01/16/2013 Elsevier  Interactive Patient Education  2017 Elsevier Inc.  

## 2016-09-25 NOTE — Progress Notes (Signed)
Chief Complaint: Follow up Extracranial Carotid Artery Stenosis   History of Present Illness  Alyssa Caldwell is a 76 y.o. female patient of Dr. 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 Arbie CookeyEarly last saw pt in February 2014. She had an outlying ultrasound suggesting 60-79% stenosis of her left internal carotid artery. This was at the lower end of the scale. Since that time she has had no new focal neurologic deficits. She did trip and fall having injury to her right shoulder which was treated with physical therapy. She also had a reaction to drugs for lupus causing hyponatremia and had syncope related to this. This has improved as well. She specifically denies any focal hemispheric weakness and aphasia or new visual problems   Patient has not had previous carotid artery intervention.  The patient denies any history of TIA or stroke symptoms, specifically the patient denies a history of amaurosis fugax or monocular blindness, denies a history unilateral of facial drooping, denies a history of hemiplegia, and denies a history of receptive or expressive aphasia.   Pt reports that she has Lupus and Chron's Disease, receiving tx for these conditions.   She had a repeat esophogeal dilation January 2016.  She had a CT chest w/o contrast on 01/13/15 at [facility] in [place], for further evaluation of ascending aortic aneurysm detected on cardiac [facility] [place], [place] on 01/05/15 which pt's records that she brought with her indicate a maximum aneurysmal dilatation of the ascending aorta at 6.3 cm. Pt is being followed for this by her cardiologist in [place], [place],Dr. Helak.   Pt Diabetic: No Pt smoker: former smoker, quit in the 1990's  Pt meds include: Statin : Yes ASA: yes, 81 mg Other anticoagulants/antiplatelets: no    Past Medical History:  Diagnosis Date  . Anemia   . Atrial fibrillation  (HCC)   . Carotid artery occlusion   . Crohn's disease (HCC)   . Dermatitis   . Diverticulitis   . Enteritis   . Enzymopathy   . Fibromyalgia   . GERD (gastroesophageal reflux disease)   . Hiatal hernia   . Hyperlipidemia   . Hyperlipidemia   . Hypertension   . Low sodium levels   . Mitral valve disorders(424.0)   . Osteopenia   . Pulmonary hypertension   . Raynaud's disease   . Raynaud's disease   . Shoulder fracture, right 07-2012  . Spontaneous ecchymoses   . Systemic lupus erythematosus (HCC)   . Vertigo     Social History Social History  Substance Use Topics  . Smoking status: Former Smoker    Types: Cigarettes    Quit date: 04/25/2002  . Smokeless tobacco: Never Used  . Alcohol use Yes    Family History Family History  Problem Relation Age of Onset  . Stroke Father   . Heart disease Father     Surgical History Past Surgical History:  Procedure Laterality Date  . ABDOMINAL HYSTERECTOMY    . APPENDECTOMY    . CHOLECYSTECTOMY    . EYE SURGERY    . PARTIAL COLECTOMY      Allergies  Allergen Reactions  . Codeine Diarrhea and Nausea And Vomiting  . Dapsone Other (See Comments)    Anemia and  Turned Blood black  . Lyrica [Pregabalin] Other (See Comments)    Bleeding, bruising, blisters and aching back  . Methotrexate Derivatives Anaphylaxis and Nausea And Vomiting    No appetite, passes  out  . Plaquenil [Hydroxychloroquine] Other (See Comments)    Vision problems  . Tape Other (See Comments)    Very sensitive to most tapes, pulls skin off  and causes bleeding.    Current Outpatient Prescriptions  Medication Sig Dispense Refill  . aspirin 81 MG tablet Take 81 mg by mouth daily.    Marland Kitchen atorvastatin (LIPITOR) 20 MG tablet Take 20 mg by mouth daily.    . calcium carbonate (TUMS - DOSED IN MG ELEMENTAL CALCIUM) 500 MG chewable tablet Chew 3,000 mg by mouth daily.    . Cholecalciferol (VITAMIN D3) 5000 UNITS CAPS Take 2,500 Units by mouth daily.     .  Clobetasol Propionate (CLOBEX SPRAY EX) Apply topically as needed.    . cloNIDine (CATAPRES) 0.1 MG tablet Take 0.1 mg by mouth as needed.    . Cyanocobalamin (VITAMIN B-12 PO) Take 5,000 mg by mouth daily.     . cyclobenzaprine (FLEXERIL) 10 MG tablet Take 10 mg by mouth 3 (three) times daily as needed. Reported on 09/20/2015    . denosumab (PROLIA) 60 MG/ML SOLN injection Inject 60 mg into the skin every 6 (six) months. Administer in upper arm, thigh, or abdomen    . fluticasone (FLONASE) 50 MCG/ACT nasal spray Place 2 sprays into the nose daily as needed. For allergies    . ibandronate (BONIVA) 150 MG tablet Take 150 mg by mouth every 30 (thirty) days. Reported on 09/20/2015    . ibuprofen (ADVIL,MOTRIN) 400 MG tablet Take 400 mg by mouth every 6 (six) hours as needed.    . inFLIXimab (REMICADE) 100 MG injection Inject 100 mg into the vein. Every eight  weeks 100mg  IV    . loratadine (CLARITIN) 10 MG tablet Take 10 mg by mouth daily.    . metoprolol succinate (TOPROL-XL) 25 MG 24 hr tablet Take 50 mg by mouth daily.     . predniSONE (DELTASONE) 10 MG tablet Take 10 mg by mouth 4 (four) times daily. Reported on 09/20/2015    . Probiotic Product (ALIGN PO) Take 1 capsule by mouth daily.     . ranitidine (ZANTAC) 150 MG tablet Take 150 mg by mouth 2 (two) times daily.    Marland Kitchen terazosin (HYTRIN) 5 MG capsule daily.  1  . zolpidem (AMBIEN) 5 MG tablet Take 5 mg by mouth at bedtime as needed. For sleep    . beta carotene w/minerals (OCUVITE) tablet Take 1 tablet by mouth daily. Reported on 09/20/2015    . feeding supplement (ENSURE COMPLETE) LIQD Take 237 mLs by mouth daily after lunch. (Patient not taking: Reported on 09/20/2015)    . omeprazole (PRILOSEC) 40 MG capsule Take 40 mg by mouth 2 (two) times daily. Reported on 09/20/2015    . ondansetron (ZOFRAN) 4 MG tablet Take 4 mg by mouth 4 (four) times daily as needed. Reported on 09/20/2015    . potassium chloride (K-DUR,KLOR-CON) 10 MEQ tablet daily.  1   . sodium chloride 1 g tablet daily.  1   No current facility-administered medications for this visit.     Review of Systems : See HPI for pertinent positives and negatives.  Physical Examination  Vitals:   09/25/16 1507 09/25/16 1508  BP: 124/75 120/74  Pulse: (!) 57   Resp: 18   Temp: 97.3 F (36.3 C)   TempSrc: Oral   SpO2: 97%   Weight: 124 lb 4.8 oz (56.4 kg)   Height: 5\' 4"  (1.626 m)    Body mass  index is 21.34 kg/m.  General: WDWN female in NAD GAIT: normal Eyes: PERRLA Pulmonary: Non-labored respirations, CTAB   Cardiac: regular rhythm, no detected murmur.  VASCULAR EXAM Carotid Bruits Right Left   Negative Negative   Aorta is not palpable. Radial pulses are 2+ palpable and equal.      LE Pulses Right Left       FEMORAL 2+ palpable 2+ palpable   POPLITEAL not palpable  not palpable   DORSALIS PEDIS  ANTERIOR TIBIAL  palpable   palpable        PT Not palpable Not palpable    Gastrointestinal: soft, nontender, BS WNL, no r/g, no palpated masses.  Musculoskeletal: Negative muscle atrophy/wasting. M/S 5/5 throughout, Extremities without ischemic changes.  Neurologic: A&O X 3; Appropriate Affect;  Speech is normal CN 2-12 intact, Pain and light touch intact in extremities, Motor exam as listed above     Assessment: Alyssa Caldwell is a 76 y.o. female who has no history of stroke or TIA. Fortunately she does not have DM and quit smoking in the 1990's.   DATA Today's carotid duplex suggests no stenosis of the right internal carotid artery and 40-59% stenosis of the left ICA. >50% stenosis of the left ECA.  Bilateral vertebral artery flow is antegrade.  Bilateral subclavian artery waveforms are normal.  No significant change compared to exams of 09/14/14 and 09-20-15.    Plan: Follow-up in 1 year with Carotid Duplex scan.    I discussed in depth with the patient the nature of atherosclerosis, and emphasized the importance of maximal medical management including strict control of blood pressure, blood glucose, and lipid levels, obtaining regular exercise, and continued cessation of smoking.  The patient is aware that without maximal medical management the underlying atherosclerotic disease process will progress, limiting the benefit of any interventions. The patient was given information about stroke prevention and what symptoms should prompt the patient to seek immediate medical care. Thank you for allowing [place] to participate in this patient's care.  Charisse March, RN, MSN, FNP-C [facility] Office: [phone]  Clinic Physician: Early  09/25/16 3:18 PM

## 2016-10-02 NOTE — Addendum Note (Signed)
Addended by: Burton ApleyPETTY, Advait Buice A on: 10/02/2016 04:58 PM   Modules accepted: Orders

## 2017-10-01 ENCOUNTER — Ambulatory Visit (INDEPENDENT_AMBULATORY_CARE_PROVIDER_SITE_OTHER): Payer: Medicare Other | Admitting: Family

## 2017-10-01 ENCOUNTER — Encounter: Payer: Self-pay | Admitting: Family

## 2017-10-01 ENCOUNTER — Ambulatory Visit (HOSPITAL_COMMUNITY)
Admission: RE | Admit: 2017-10-01 | Discharge: 2017-10-01 | Disposition: A | Discharge status: Home / Self Care | Payer: Medicare Other | Source: Ambulatory Visit | Attending: Family | Admitting: Family

## 2017-10-01 VITALS — BP 188/109 | HR 74 | Resp 18 | Ht 64.0 in | Wt 118.3 lb

## 2017-10-01 DIAGNOSIS — I6522 Occlusion and stenosis of left carotid artery: Secondary | ICD-10-CM | POA: Insufficient documentation

## 2017-10-01 DIAGNOSIS — Z87891 Personal history of nicotine dependence: Secondary | ICD-10-CM | POA: Diagnosis not present

## 2017-10-01 NOTE — Progress Notes (Signed)
Chief Complaint: Follow up Extracranial Carotid Artery Stenosis   History of Present Illness  Alyssa Caldwell is a 77 y.o. 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  Dr. Arbie CookeyEarly last saw pt in February 2014. She had an outlying ultrasound suggesting 60-79% stenosis of her left internal carotid artery. This was at the lower end of the scale. Since that time she has had no new focal neurologic deficits. She did trip and fall having injury to her right shoulder which was treated with physical therapy. She also had a reaction to drugs for lupus causing hyponatremia and had syncope related to this. This has improved as well. She specifically denies any focal hemispheric weakness and aphasia or new visual problems   Patient has not had previous carotid artery intervention.  The patient denies any history of TIA or stroke symptoms, specifically she denies a history of amaurosis fugax or monocular blindness, unilateral of facial drooping, hemiplegia, or receptive or expressive aphasia.   Pt reports that she has Lupus and Chron's Disease, receiving tx for these conditions.   She had a repeat esophogeal dilation January 2016.  In her gastroenterologist office on 09-19-17 her blood pressure was 162/88, is more elevated now.  She denies headache, denies feeling light headed, denies chest pain or dyspnea.  She had a CT chest w/o contrast on 01/13/15 [facility] [place], for further evaluation of ascending aortic aneurysm detected [facility] cardiac [facility] [place], [place] on 01/05/15 which pt's records that she brought with her indicate a maximum aneurysmal dilatation of the ascending aorta at 6.3 cm. Pt is being followed for this by her cardiologist in [place], [place],Dr. Helak.   Pt Diabetic: No Pt smoker: former smoker, quit in the 1990's  Pt meds include: Statin : Yes ASA: yes, 81 mg Other  anticoagulants/antiplatelets: no    Past Medical History:  Diagnosis Date  . Anemia   . Atrial fibrillation (HCC)   . Carotid artery occlusion   . Crohn's disease (HCC)   . Dermatitis   . Diverticulitis   . Enteritis   . Enzymopathy   . Fibromyalgia   . GERD (gastroesophageal reflux disease)   . Hiatal hernia   . Hyperlipidemia   . Hyperlipidemia   . Hypertension   . Low sodium levels   . Mitral valve disorders(424.0)   . Osteopenia   . Pulmonary hypertension (HCC)   . Raynaud's disease   . Raynaud's disease   . Shoulder fracture, right 07-2012  . Spontaneous ecchymoses   . Systemic lupus erythematosus (HCC)   . Vertigo     Social History Social History   Tobacco Use  . Smoking status: Former Smoker    Types: Cigarettes    Last attempt to quit: 04/25/2002    Years since quitting: 15.4  . Smokeless tobacco: Never Used  Substance Use Topics  . Alcohol use: Yes  . Drug use: No    Family History Family History  Problem Relation Age of Onset  . Stroke Father   . Heart disease Father     Surgical History Past Surgical History:  Procedure Laterality Date  . ABDOMINAL HYSTERECTOMY    . APPENDECTOMY    . CHOLECYSTECTOMY    . EYE SURGERY    . PARTIAL COLECTOMY      Allergies  Allergen Reactions  . Codeine Diarrhea and Nausea And Vomiting  . Dapsone Other (See Comments)    Anemia and  Turned Blood black  . Lyrica [Pregabalin] Other (See Comments)    Bleeding, bruising, blisters and aching back  . Methotrexate Derivatives Anaphylaxis and Nausea And Vomiting    No appetite, passes out  . Plaquenil [Hydroxychloroquine] Other (See Comments)    Vision problems  . Tape Other (See Comments)    Very sensitive to most tapes, pulls skin off  and causes bleeding.    Current Outpatient Medications  Medication Sig Dispense Refill  . aspirin 81 MG tablet Take 81 mg by mouth daily.    Marland Kitchen atorvastatin (LIPITOR) 20 MG tablet Take 20 mg by mouth daily.    . beta  carotene w/minerals (OCUVITE) tablet Take 1 tablet by mouth daily. Reported on 09/20/2015    . calcium carbonate (TUMS - DOSED IN MG ELEMENTAL CALCIUM) 500 MG chewable tablet Chew 3,000 mg by mouth daily.    . Cholecalciferol (VITAMIN D3) 5000 UNITS CAPS Take 2,500 Units by mouth daily.     . Clobetasol Propionate (CLOBEX SPRAY EX) Apply topically as needed.    . cloNIDine (CATAPRES) 0.1 MG tablet Take 0.1 mg by mouth as needed.    . Cyanocobalamin (VITAMIN B-12 PO) Take 5,000 mg by mouth daily.     . cyclobenzaprine (FLEXERIL) 10 MG tablet Take 10 mg by mouth 3 (three) times daily as needed. Reported on 09/20/2015    . denosumab (PROLIA) 60 MG/ML SOLN injection Inject 60 mg into the skin every 6 (six) months. Administer in upper arm, thigh, or abdomen    . feeding supplement (ENSURE COMPLETE) LIQD Take 237 mLs by mouth daily after lunch.    . fluticasone (FLONASE) 50 MCG/ACT nasal spray Place 2 sprays into the nose daily as needed. For allergies    . ibandronate (BONIVA) 150 MG tablet Take 150 mg by mouth every 30 (thirty) days. Reported on 09/20/2015    . ibuprofen (ADVIL,MOTRIN) 400 MG tablet Take 400 mg by mouth every 6 (six) hours as needed.    . inFLIXimab (REMICADE) 100 MG injection Inject 100 mg into the vein. Every eight  weeks 100mg  IV    . loratadine (CLARITIN) 10 MG tablet Take 10 mg by mouth daily.    . metoprolol succinate (TOPROL-XL) 25 MG 24 hr tablet Take 50 mg by mouth daily.     Marland Kitchen omeprazole (PRILOSEC) 40 MG capsule Take 40 mg by mouth 2 (two) times daily. Reported on 09/20/2015    . ondansetron (ZOFRAN) 4 MG tablet Take 4 mg by mouth 4 (four) times daily as needed. Reported on 09/20/2015    . potassium chloride (K-DUR,KLOR-CON) 10 MEQ tablet daily.  1  . predniSONE (DELTASONE) 10 MG tablet Take 10 mg by mouth 4 (four) times daily. Reported on 09/20/2015    . Probiotic Product (ALIGN PO) Take 1 capsule by mouth daily.     . ranitidine (ZANTAC) 150 MG tablet Take 150 mg by mouth 2  (two) times daily.    . sodium chloride 1 g tablet daily.  1  . terazosin (HYTRIN) 5 MG capsule daily.  1  . zolpidem (AMBIEN) 5 MG tablet Take 5 mg by mouth at bedtime as needed. For sleep     No current facility-administered medications for this visit.     Review of Systems : See HPI for pertinent positives and negatives.  Physical Examination  Vitals:   10/01/17 1529 10/01/17 1531  BP: (!) 190/92 (!) 188/109  Pulse: 74   Resp: 18   SpO2: 95%   Weight:  118 lb 4.8 oz (53.7 kg)   Height: 5\' 4"  (1.626 m)    Body mass index is 20.31 kg/m.  General:  WDWN female in NAD GAIT:normal HENT: No gross abnormalities  Eyes: PERRLA Pulmonary: Non-labored respirations, CTAB   Cardiac: regular rhythm, no detected murmur.  VASCULAR EXAM Carotid Bruits Right Left   Negative Negative    Abdominal aortic pulse is not palpable. Radial pulses are 2+ palpable and equal.      LE Pulses Right Left  FEMORAL 2+ palpable 2+ palpable   POPLITEAL not palpable  not palpable   DORSALIS PEDIS  ANTERIOR TIBIAL palpable  palpable   PT Not palpable Not palpable    Gastrointestinal: soft, nontender, BS WNL, no r/g, no palpated masses. Musculoskeletal: No muscle atrophy/wasting. M/S 5/5 throughout, extremities without ischemic changes. Skin: No rashes, no ulcers, no cellulitis.   Neurologic:  A&O X 3; appropriate affect, sensation is normal; speech is normal, CN 2-12 intact except is slightly hard of hearing, pain and light touch intact in extremities, motor exam as listed above. Psychiatric: Normal thought content, mood appropriate to clinical situation.    Assessment: Alyssa Caldwell is a 77 y.o. female who has no history of stroke or TIA. Fortunately she does not have DM and quit smoking in the 1990's.   I  discussed with pt and her daughter re checking her blood pressure daily at home, where it will likely be lower, recording her blood pressures with dates, and bringing this record with her to her PCP's office.   DATA Carotid Duplex (10/01/17): No stenosis of the right internal carotid artery 1-39% stenosis of the left ICA. >50% stenosis of the left ECA.  Bilateral vertebral artery flow is antegrade.  Bilateral subclavian artery waveforms are normal.  No significant change compared to exams of 09/14/14, 09-20-15, and 09-25-16.   Plan:  Follow-up in 18 months with Carotid Duplex scan.  I discussed in depth with the patient the nature of atherosclerosis, and emphasized the importance of maximal medical management including strict control of blood pressure, blood glucose, and lipid levels, obtaining regular exercise, and continued cessation of smoking.  The patient is aware that without maximal medical management the underlying atherosclerotic disease process will progress, limiting the benefit of any interventions. The patient was given information about stroke prevention and what symptoms should prompt the patient to seek immediate medical care. Thank you for allowing [place] to participate in this patient's care.  Charisse March, RN, MSN, FNP-C [facility] Office: [phone]  Clinic Physician: Early  10/01/17 3:40 PM

## 2017-10-01 NOTE — Patient Instructions (Signed)
